# Patient Record
Sex: Male | Born: 1935 | Race: White | Hispanic: No
Health system: Southern US, Community
[De-identification: ages and names within clinical notes are randomized; demographics above are authoritative.]

---

## 2013-03-16 ENCOUNTER — Other Ambulatory Visit: Payer: Self-pay

## 2013-03-16 ENCOUNTER — Inpatient Hospital Stay (HOSPITAL_COMMUNITY)
Admission: EM | Admit: 2013-03-16 | Discharge: 2013-04-04 | DRG: 871 | Disposition: E | Payer: Medicare Other | Attending: Pulmonary Disease | Admitting: Pulmonary Disease

## 2013-03-16 ENCOUNTER — Emergency Department (HOSPITAL_COMMUNITY): Payer: Medicare Other

## 2013-03-16 DIAGNOSIS — Z881 Allergy status to other antibiotic agents status: Secondary | ICD-10-CM

## 2013-03-16 DIAGNOSIS — K449 Diaphragmatic hernia without obstruction or gangrene: Secondary | ICD-10-CM | POA: Diagnosis present

## 2013-03-16 DIAGNOSIS — Z66 Do not resuscitate: Secondary | ICD-10-CM | POA: Diagnosis not present

## 2013-03-16 DIAGNOSIS — E872 Acidosis, unspecified: Secondary | ICD-10-CM | POA: Diagnosis present

## 2013-03-16 DIAGNOSIS — G9349 Other encephalopathy: Secondary | ICD-10-CM | POA: Diagnosis present

## 2013-03-16 DIAGNOSIS — K562 Volvulus: Secondary | ICD-10-CM | POA: Diagnosis present

## 2013-03-16 DIAGNOSIS — E2749 Other adrenocortical insufficiency: Secondary | ICD-10-CM | POA: Diagnosis present

## 2013-03-16 DIAGNOSIS — J690 Pneumonitis due to inhalation of food and vomit: Secondary | ICD-10-CM

## 2013-03-16 DIAGNOSIS — A419 Sepsis, unspecified organism: Principal | ICD-10-CM | POA: Diagnosis present

## 2013-03-16 DIAGNOSIS — J4489 Other specified chronic obstructive pulmonary disease: Secondary | ICD-10-CM | POA: Diagnosis present

## 2013-03-16 DIAGNOSIS — J449 Chronic obstructive pulmonary disease, unspecified: Secondary | ICD-10-CM | POA: Diagnosis present

## 2013-03-16 DIAGNOSIS — I498 Other specified cardiac arrhythmias: Secondary | ICD-10-CM | POA: Diagnosis present

## 2013-03-16 DIAGNOSIS — N179 Acute kidney failure, unspecified: Secondary | ICD-10-CM | POA: Diagnosis present

## 2013-03-16 DIAGNOSIS — D696 Thrombocytopenia, unspecified: Secondary | ICD-10-CM | POA: Diagnosis not present

## 2013-03-16 DIAGNOSIS — I469 Cardiac arrest, cause unspecified: Secondary | ICD-10-CM | POA: Diagnosis present

## 2013-03-16 DIAGNOSIS — Z515 Encounter for palliative care: Secondary | ICD-10-CM

## 2013-03-16 DIAGNOSIS — I10 Essential (primary) hypertension: Secondary | ICD-10-CM | POA: Diagnosis present

## 2013-03-16 DIAGNOSIS — K319 Disease of stomach and duodenum, unspecified: Secondary | ICD-10-CM | POA: Diagnosis present

## 2013-03-16 DIAGNOSIS — J9600 Acute respiratory failure, unspecified whether with hypoxia or hypercapnia: Secondary | ICD-10-CM | POA: Diagnosis present

## 2013-03-16 LAB — POCT I-STAT TROPONIN I: Troponin i, poc: 0 ng/mL (ref 0.00–0.08)

## 2013-03-16 LAB — COMPREHENSIVE METABOLIC PANEL
ALT: 18 U/L (ref 0–53)
AST: 25 U/L (ref 0–37)
Alkaline Phosphatase: 84 U/L (ref 39–117)
CO2: 19 mEq/L (ref 19–32)
Chloride: 102 mEq/L (ref 96–112)
GFR calc Af Amer: 55 mL/min — ABNORMAL LOW (ref >90)
GFR calc non Af Amer: 47 mL/min — ABNORMAL LOW (ref >90)
Potassium: 3.3 mEq/L — ABNORMAL LOW (ref 3.5–5.1)
Sodium: 139 mEq/L (ref 135–145)
Total Bilirubin: 0.4 mg/dL (ref 0.3–1.2)

## 2013-03-16 LAB — LACTIC ACID, PLASMA: Lactic Acid, Venous: 6.9 mmol/L — ABNORMAL HIGH (ref 0.5–2.2)

## 2013-03-16 LAB — POCT I-STAT 3, ART BLOOD GAS (G3+)
Bicarbonate: 22.8 mEq/L (ref 20.0–24.0)
O2 Saturation: 85 %
Patient temperature: 98.6
pCO2 arterial: 71.8 mmHg (ref 35.0–45.0)
pH, Arterial: 7.11 — CL (ref 7.350–7.450)
pO2, Arterial: 69 mmHg — ABNORMAL LOW (ref 80.0–100.0)

## 2013-03-16 LAB — CBC WITH DIFFERENTIAL/PLATELET
Basophils Absolute: 0 10*3/uL (ref 0.0–0.1)
HCT: 43 % (ref 39.0–52.0)
Lymphocytes Relative: 12 % (ref 12–46)
MCHC: 33.5 g/dL (ref 30.0–36.0)
Neutro Abs: 13.9 10*3/uL — ABNORMAL HIGH (ref 1.7–7.7)
Neutrophils Relative %: 83 % — ABNORMAL HIGH (ref 43–77)
Platelets: 123 10*3/uL — ABNORMAL LOW (ref 150–400)
RDW: 13.6 % (ref 11.5–15.5)
WBC: 16.8 10*3/uL — ABNORMAL HIGH (ref 4.0–10.5)

## 2013-03-16 LAB — APTT: aPTT: 27 seconds (ref 24–37)

## 2013-03-16 LAB — AMYLASE: Amylase: 66 U/L (ref 0–105)

## 2013-03-16 LAB — PROTIME-INR: Prothrombin Time: 14.8 seconds (ref 11.6–15.2)

## 2013-03-16 MED ORDER — PANTOPRAZOLE SODIUM 40 MG IV SOLR
40.0000 mg | Freq: Every day | INTRAVENOUS | Status: DC
  Administered 2013-03-17: 40 mg via INTRAVENOUS
  Filled 2013-03-16 (×2): qty 40

## 2013-03-16 MED ORDER — INSULIN ASPART 100 UNIT/ML ~~LOC~~ SOLN
2.0000 [IU] | SUBCUTANEOUS | Status: DC
  Administered 2013-03-17: 4 [IU] via SUBCUTANEOUS
  Administered 2013-03-17: 2 [IU] via SUBCUTANEOUS
  Administered 2013-03-17: 4 [IU] via SUBCUTANEOUS
  Administered 2013-03-17 – 2013-03-18 (×3): 6 [IU] via SUBCUTANEOUS

## 2013-03-16 MED ORDER — IPRATROPIUM BROMIDE 0.02 % IN SOLN
0.5000 mg | RESPIRATORY_TRACT | Status: DC
  Administered 2013-03-17 – 2013-03-18 (×8): 0.5 mg via RESPIRATORY_TRACT
  Filled 2013-03-16 (×8): qty 2.5

## 2013-03-16 MED ORDER — SODIUM CHLORIDE 0.9 % IV SOLN: 250.0000 mL | INTRAVENOUS | Status: DC | PRN

## 2013-03-16 MED ORDER — HEPARIN SODIUM (PORCINE) 5000 UNIT/ML IJ SOLN
5000.0000 [IU] | Freq: Three times a day (TID) | INTRAMUSCULAR | Status: DC
  Administered 2013-03-17 – 2013-03-18 (×4): 5000 [IU] via SUBCUTANEOUS
  Filled 2013-03-16 (×7): qty 1

## 2013-03-16 MED ORDER — POTASSIUM CHLORIDE 10 MEQ/50ML IV SOLN
10.0000 meq | INTRAVENOUS | Status: AC
  Administered 2013-03-17 (×4): 10 meq via INTRAVENOUS
  Filled 2013-03-16 (×4): qty 50

## 2013-03-16 MED ORDER — ROCURONIUM BROMIDE 50 MG/5ML IV SOLN
100.0000 mg | Freq: Once | INTRAVENOUS | Status: AC
  Administered 2013-03-16: 100 mg via INTRAVENOUS

## 2013-03-16 MED ORDER — ALBUTEROL SULFATE (5 MG/ML) 0.5% IN NEBU
2.5000 mg | INHALATION_SOLUTION | RESPIRATORY_TRACT | Status: DC
  Administered 2013-03-17 – 2013-03-18 (×8): 2.5 mg via RESPIRATORY_TRACT
  Filled 2013-03-16 (×8): qty 0.5

## 2013-03-16 MED ORDER — ASPIRIN 81 MG PO CHEW: 324.0000 mg | CHEWABLE_TABLET | ORAL | Status: AC

## 2013-03-16 MED ORDER — ASPIRIN 300 MG RE SUPP
300.0000 mg | RECTAL | Status: AC
  Administered 2013-03-16: 300 mg via RECTAL
  Filled 2013-03-16: qty 1

## 2013-03-16 MED ORDER — ETOMIDATE 2 MG/ML IV SOLN
30.0000 mg | Freq: Once | INTRAVENOUS | Status: AC
  Administered 2013-03-16: 30 mg via INTRAVENOUS

## 2013-03-16 NOTE — ED Provider Notes (Signed)
I was present during the intubation and directly supervised the resident perform the intubation.   Gilda Crease, MD 03/23/2013 [phone]

## 2013-03-16 NOTE — ED Provider Notes (Addendum)
CSN: [id number]     Arrival date & time 03/27/2013  2249 History   First MD Initiated Contact with Patient 03/05/2013 2302     Chief Complaint  Patient presents with  . Cardiac Arrest   (Consider location/radiation/quality/duration/timing/severity/associated sxs/prior Treatment) HPI Comments: And brought to the by ambulance after apparent cardiac arrest. Patient had been experiencing chest pain at home and 911 was called. Upon arrival to the ER, patient was awake, alert and oriented continuing to complain of chest pain. Patient was being transported when he suddenly went unconscious and vomiting. EMS reports that he became briefly pulseless. They initiated CPR, administered epinephrine and atropine for one dose each. They estimate after approximately 2 minutes of CPR, patient regained pulses. He has, however, still unresponsive. They were unable to intubate, did place the [facility] airway. Arrival, patient is being bagged via [facility] airway. Level V Caveat due to patient condition.   No past medical history on file. No past surgical history on file. No family history on file. History  Substance Use Topics  . Smoking status: Not on file  . Smokeless tobacco: Not on file  . Alcohol Use: Not on file    Review of Systems  Unable to perform ROS: Acuity of condition    Allergies  Review of patient's allergies indicates not on file.  Home Medications   Current Outpatient Rx  Name  Route  Sig  Dispense  Refill  . EPINEPHRINE IJ   Injection   Inject 1 mg as directed once.          BP 110/70  Pulse 121  Resp 16  SpO2 98% Physical Exam  Constitutional: He appears well-developed and well-nourished.  HENT:  Head: Normocephalic and atraumatic.  Eyes: Pupils are equal, round, and reactive to light.  Neck: Neck supple.  Cardiovascular: Normal rate, regular rhythm and normal heart sounds.   Pulmonary/Chest:  Antibiotic [facility] airway, coarse breath sounds bilaterally  Abdominal: He exhibits  distension.  Musculoskeletal: He exhibits no edema.  Neurological:  Unresponsive  Skin: Skin is warm and dry.    ED Course  Procedures (including critical care time) Labs Review Labs Reviewed  CG4 I-STAT (LACTIC ACID) - Abnormal; Notable for the following:    Lactic Acid, Venous 6.96 (*)    All other components within normal limits  CULTURE, BLOOD (ROUTINE X 2)  CULTURE, BLOOD (ROUTINE X 2)  URINE CULTURE  CULTURE, RESPIRATORY (NON-EXPECTORATED)  CBC WITH DIFFERENTIAL  COMPREHENSIVE METABOLIC PANEL  PRO B NATRIURETIC PEPTIDE  TROPONIN I  PROTIME-INR  MAGNESIUM  PHOSPHORUS  AMYLASE  LIPASE, BLOOD  TROPONIN I  TROPONIN I  TROPONIN I  LACTIC ACID, PLASMA  CORTISOL  APTT  BLOOD GAS, ARTERIAL  POCT I-STAT TROPONIN I  TYPE AND SCREEN   Imaging Review No results found.  EKG Interpretation   None       MDM  Diagnosis: 1. Respiratory arrest, 2. Possible Cardiac contrast  Patient brought to the ER after her brief period of CPR during transport. He was initially called the EMS as a chest pain. Upon their arrival he was awake and alert complaining of continued chest pain. EKG at arrival does not show obvious ischemia or infarct.  Brought to the ER he was reevaluated. She had vomited during transport and there is concern for aspiration based on this. He patient was unresponsive at arrival, but did start to exhibit purposeful movement. Attempt was made to remove the [facility] airway, the patient was not protecting his airway and  not taking enough, therefore it was decided to admit the patient. Patient was intubated without difficulty.  Critical care was contacted. Patient will be admitted to critical care. Additionally, cardiology was consult and they will follow along.  CRITICAL CARE Performed by: Gilda Crease   Total critical care time:  Critical care time was exclusive of separately billable procedures and treating other patients.  Critical care was  necessary to treat or prevent imminent or life-threatening deterioration.  Critical care was time spent personally by me on the following activities: development of treatment plan with patient and/or surrogate as well as nursing, discussions with consultants, evaluation of patient's response to treatment, examination of patient, obtaining history from patient or surrogate, ordering and performing treatments and interventions, ordering and review of laboratory studies, ordering and review of radiographic studies, pulse oximetry and re-evaluation of patient's condition.   Gilda Crease, MD 03/15/2013 2325  Gilda Crease, MD 03/05/2013 [phone]

## 2013-03-16 NOTE — Progress Notes (Signed)
ANTIBIOTIC CONSULT NOTE - INITIAL  Pharmacy Consult for Vancomycin and Zosyn  Indication: rule out pneumonia  Allergies no known allergies  Patient Measurements: Height: 5\' 9"  (175.3 cm) Weight: 204 lb 9.4 oz (92.8 kg) IBW/kg (Calculated) : 70.7  Vital Signs: Temp: 94.6 F (34.8 C) (12/14 0032) Temp src: Core (Comment) (12/14 0032) BP: 118/97 mmHg (12/14 0032) Pulse Rate: 115 (12/14 0032) Intake/Output from previous day:   Intake/Output from this shift:    Labs:  Recent Labs  03/17/2013 2244  WBC 16.8*  HGB 14.4  PLT 123*  CREATININE 1.38*   Estimated Creatinine Clearance: 49.6 ml/min (by C-G formula based on Cr of 1.38). No results found for this basename: VANCOTROUGH, VANCOPEAK, VANCORANDOM, GENTTROUGH, GENTPEAK, GENTRANDOM, TOBRATROUGH, TOBRAPEAK, TOBRARND, AMIKACINPEAK, AMIKACINTROU, AMIKACIN,  in the last 72 hours   Microbiology: No results found for this or any previous visit (from the past 720 hour(s)).  Medical History: No past medical history on file.  Medications:  Maxzide  Flomax  Prilosec  Arthrotec  Klonopin  Assessment: 79 yo male with VRDF, possible PNA, for empiric antibiotics  Goal of Therapy:  Vancomycin trough level 15-20 mcg/ml  Plan:  Vancomycin 1500 mg IV now, then 1 g IV q24h Zosyn 3.375 g IV q8h   Rayhaan Huster, Gary Fleet 03/17/2013,12:33 AM

## 2013-03-16 NOTE — ED Provider Notes (Signed)
HPI Review of Systems Physical Exam INTUBATION Date/Time: 2013/03/28 11:19 PM Performed by: Gilda Crease. Authorized by: Arloa Koh Consent: The procedure was performed in an emergent situation. Indications: respiratory failure and airway protection Patient status: paralyzed (RSI) Sedatives: etomidate Paralytic: rocuronium Laryngoscope size: Mac 3 Tube size: 7.5 mm Tube type: cuffed Number of attempts: 1 Cricoid pressure: no Cords visualized: no Post-procedure assessment: chest rise and ETCO2 monitor Breath sounds: equal Cuff inflated: yes ETT to lip: 23 cm Tube secured with: ETT holder Chest x-ray interpreted by me. Chest x-ray findings: endotracheal tube in appropriate position Patient tolerance: Patient tolerated the procedure well with no immediate complications. Comments: Copious vomit in airway prior to me intubating      Arloa Koh, MD 03/28/13 2322

## 2013-03-16 NOTE — ED Notes (Signed)
Pt attempting to talk to staff at this time.  Pt mumbling words, but not able to maintain communication

## 2013-03-16 NOTE — H&P (Addendum)
PULMONARY  / CRITICAL CARE MEDICINE  Name: Gerald Moses MRN: [id number] DOB: Aug 05, 1932    ADMISSION DATE:  03/11/2013  PRIMARY SERVICE: PCCM  CHIEF COMPLAINT:   Chest pain, cardiac arrest.  BRIEF PATIENT DESCRIPTION:  79 years old male with no records here at [facility] and unknown PMH. As per EMS and nurses in the ED 911 was called because of chest pain and vomiting for 2 days. The patient briefly coded in the ambulance and returned to spontaneous circulation after about two minutes of CPR.   SIGNIFICANT EVENTS / STUDIES:  - Chest X ray with no lung infiltrates. ETT in adequate position.   LINES / TUBES: - Peripheral IV's - Left IJ CVC - Foley catheter.   CULTURES: Will get blood, urine and tracheal aspirate cultures.  ANTIBIOTICS: - Zosyn - Vancomycin  HISTORY OF PRESENT ILLNESS:   79 years old male with no records here at [facility] and unknown PMH. As per his medication bottles he probably has HTN, COPD and prostate issues. He takes benzodiazepines and narcotics. As per EMS and nurses in the ED 911 was called because of chest pain and vomiting for 2 days. In the ambulance the patient vomited and briefly became pulseless. CPR was given for less than 2 minutes. Atropine and epinephrine x 1 were given with return to spontaneous circulation. They placed a [facility] airway. In the ED he briefly recovered consciousness and was disoriented. During intubation, copious amounts of vomit were noticed in the airway. At the time of my examination the patient is intubated, unresponsive, Saturating 100% on 100% FiO2. MAP's in the 70's.   PAST MEDICAL HISTORY :  No past medical history on file. No past surgical history on file. Prior to Admission medications   Medication Sig Start Date End Date Taking? Authorizing Provider  albuterol (PROVENTIL HFA;VENTOLIN HFA) 108 (90 BASE) MCG/ACT inhaler Inhale 1-2 puffs into the lungs every 6 (six) hours as needed for wheezing or shortness of breath.   Yes  Historical Provider, MD  cefdinir (OMNICEF) 300 MG capsule Take 300 mg by mouth 2 (two) times daily. Beginning 03/11/13 for 10 days   Yes Historical Provider, MD  clonazePAM (KLONOPIN) 1 MG tablet Take 1 mg by mouth 2 (two) times daily as needed for anxiety.   Yes Historical Provider, MD  diclofenac (VOLTAREN) 25 MG EC tablet Take 25 mg by mouth daily. Take with misoprostol   Yes Historical Provider, MD  finasteride (PROSCAR) 5 MG tablet Take 5 mg by mouth daily.   Yes Historical Provider, MD  fluticasone (FLONASE) 50 MCG/ACT nasal spray Place 1-2 sprays into both nostrils daily.   Yes Historical Provider, MD  HYDROcodone-acetaminophen (NORCO) 10-325 MG per tablet Take 0.5-1 tablets by mouth every 4 (four) hours as needed for moderate pain.   Yes Historical Provider, MD  misoprostol (CYTOTEC) 200 MCG tablet Take 200 mcg by mouth daily. Take with diclofenac   Yes Historical Provider, MD  Multiple Vitamin (MULTIVITAMIN WITH MINERALS) TABS tablet Take 1 tablet by mouth daily.   Yes Historical Provider, MD  omeprazole (PRILOSEC) 20 MG capsule Take 40 mg by mouth daily.   Yes Historical Provider, MD  oxymetazoline (AFRIN) 0.05 % nasal spray Place 1-2 sprays into both nostrils 2 (two) times daily as needed for congestion.   Yes Historical Provider, MD  tamsulosin (FLOMAX) 0.4 MG CAPS capsule Take 0.4 mg by mouth.   Yes Historical Provider, MD  triamterene-hydrochlorothiazide (MAXZIDE-25) 37.5-25 MG per tablet Take 0.5-1 tablets by mouth daily.  Yes Historical Provider, MD  atropine 1 MG/ML injection Inject 1 mg into the vein once.    Historical Provider, MD  EPINEPHRINE IJ Inject 1 mg as directed once.    Historical Provider, MD   Allergies no known allergies  FAMILY HISTORY:  No family history on file. SOCIAL HISTORY:  has no tobacco, alcohol, and drug history on file.  REVIEW OF SYSTEMS:  Unable to provide  SUBJECTIVE:   VITAL SIGNS: Temp:  [95.6 F (35.3 C)] 95.6 F (35.3 C) (12/13  2308) Pulse Rate:  [112-121] 119 (12/13 2315) Resp:  [16-22] 22 (12/13 2315) BP: (103-126)/(68-73) 126/73 mmHg (12/13 2315) SpO2:  [93 %-99 %] 93 % (12/13 2315) FiO2 (%):  [60 %-100 %] 100 % (12/13 2300) HEMODYNAMICS:   VENTILATOR SETTINGS: Vent Mode:  [-] PRVC FiO2 (%):  [60 %-100 %] 100 % Set Rate:  [22 bmp-28 bmp] 28 bmp Vt Set:  [440 mL] 440 mL PEEP:  [5 cmH20] 5 cmH20 Plateau Pressure:  [18 cmH20] 18 cmH20 INTAKE / OUTPUT: Intake/Output   None     PHYSICAL EXAMINATION: General: Sedated, intubated, no acute distress. Eyes: Anicteric sclerae. Pupils are ENT: ETT in place. Trachea at midline.  Lymph: No cervical, supraclavicular, or axillary lymphadenopathy. Heart: Normal S1, S2. No murmurs, rubs, or gallops appreciated. No bruits, equal pulses. Lungs: Normal excursion, no dullness to percussion. Good air movement bilaterally, without wheezes or crackles.  Abdomen: Abdomen soft, non-tender and not distended, normoactive bowel sounds. No hepatosplenomegaly or masses. Musculoskeletal: No clubbing or synovitis. No LE edema Skin: No rashes or lesions Neuro: Patient is unresponsive.   LABS:  CBC  Recent Labs Lab 03/17/2013 2244  WBC 16.8*  HGB 14.4  HCT 43.0  PLT 123*   Coag's  Recent Labs Lab 03/30/2013 2244 03/17/2013 2300  APTT  --  27  INR 1.19  --    BMET  Recent Labs Lab 03/27/2013 2244  NA 139  K 3.3*  CL 102  CO2 19  BUN 19  CREATININE 1.38*  GLUCOSE 269*   Electrolytes  Recent Labs Lab 03/17/2013 2244  CALCIUM 8.7   Sepsis Markers  Recent Labs Lab 03/13/2013 2252  LATICACIDVEN 6.96*   ABG  Recent Labs Lab 04/01/2013 2332  PHART 7.110*  PCO2ART 71.8*  PO2ART 69.0*   Liver Enzymes  Recent Labs Lab 03/09/2013 2244  AST 25  ALT 18  ALKPHOS 84  BILITOT 0.4  ALBUMIN 3.6   Cardiac Enzymes  Recent Labs Lab 03/13/2013 2244  TROPONINI <0.30  PROBNP 137.2   Glucose No results found for this basename: GLUCAP,  in the last 168  hours  Imaging Dg Chest Port 1 View  04/03/2013   CLINICAL DATA:  Endotracheal tube placement.  EXAM: PORTABLE CHEST - 1 VIEW  COMPARISON:  None.  FINDINGS: The patient's endotracheal tube is seen ending 3-4 cm above the carina.  The lungs are well-aerated. Vascular congestion is noted, without definite pulmonary edema. No pleural effusion or pneumothorax is seen.  The cardiomediastinal silhouette remains normal in size. There is suggestion of an underlying moderate hiatal hernia. No acute osseous abnormalities are seen. The patient's left shoulder arthroplasty is incompletely imaged but appears grossly unremarkable  IMPRESSION: 1. Endotracheal tube seen ending 3-4 cm above the carina. 2. Vascular congestion noted; lungs remain grossly clear. 3. Suggestion of underlying moderate hiatal hernia. If the patient does not have a known history of hiatal hernia, a lateral view of the chest could be considered for further evaluation,  though the appearance is relatively typical.   Electronically Signed   By: Roanna Raider M.D.   On: 03/06/2013 23:40     ASSESSMENT / PLAN:  PULMONARY A: 1) Acute hypoxemic respiratory failure secondary to cardiac arrest. 2) Aspiration pneumonitis / pneumonia P:   - Mechanical ventilation   - PRVC, Vt: 8cc/kg, PEEP: 5, RR: 22, FiO2: 100% and adjust to keep O2 sat > 94%   - VAP prevention order set   - Daily awakening and SBT   CARDIOVASCULAR A:  1) Witnessed cardiac arrest, down time less than two minutes. In the ED regained consciousness briefly. Not a candidate for hypothermia protocol.  2) EKG sinus rhythm. No STEMI 3) Chest pain 4) Lactic acidosis P:  - Aspirin - Cardiology consult - Serial troponins - Echocardiogram - Cardiology consult  RENAL A:   1) Acute renal failure P:   - IVF's - Follow BMP  GASTROINTESTINAL A:   1) Vomiting, unclear etiology P:   - Will get non contrast CT of the abdomen given elevated creatinine. - GI prophylaxis with  protonix  HEMATOLOGIC A:   1) No issues P:  - Will follow CBC  INFECTIOUS A:   1) Aspiration. Unclear if any other source of infection. Will cover empirically and follow cultures. P:   - Zosyn - Vancomycin - Follow cultures and adjust antibiotics accordingly  ENDOCRINE A:   1) Hyperglycemia P:   - ICU hyperglycemia protocol with SQ insulin sliding scale.   NEUROLOGIC A:   1) Intubated, sedated P:   - Propofol    I have personally obtained a history, examined the patient, evaluated laboratory and imaging results, formulated the assessment and plan and placed orders. CRITICAL CARE: Critical Care Time devoted to patient care services described in this note is 60 minutes.   Overton Mam, MD Pulmonary and Critical Care Medicine [facility] Pager: [phone]  03/10/2013, 11:46 PM

## 2013-03-16 NOTE — ED Notes (Signed)
Lactic Acid results reported to Dr.Pollina

## 2013-03-16 NOTE — ED Notes (Signed)
Pt arrives via EMS unconsious and being bagged through [facility].  Pt called 911 for complaint of chest pain.  During transport, pt began to vomit and went unconscious.  Pt vomited multiple more times in route, and soiled himself of urine.  Pt was given one cycle of CPR, 1 mg of EPI, and 1 MG of atropine.  EMS reports pt stated he had been vomiting off and on for two days with some abd discomfort.

## 2013-03-16 NOTE — Progress Notes (Signed)
Changes per MD 

## 2013-03-17 ENCOUNTER — Inpatient Hospital Stay (HOSPITAL_COMMUNITY): Payer: Medicare Other

## 2013-03-17 DIAGNOSIS — I469 Cardiac arrest, cause unspecified: Secondary | ICD-10-CM

## 2013-03-17 DIAGNOSIS — R579 Shock, unspecified: Secondary | ICD-10-CM

## 2013-03-17 DIAGNOSIS — J690 Pneumonitis due to inhalation of food and vomit: Secondary | ICD-10-CM

## 2013-03-17 DIAGNOSIS — K562 Volvulus: Secondary | ICD-10-CM

## 2013-03-17 DIAGNOSIS — E872 Acidosis: Secondary | ICD-10-CM

## 2013-03-17 DIAGNOSIS — R079 Chest pain, unspecified: Secondary | ICD-10-CM

## 2013-03-17 DIAGNOSIS — J9600 Acute respiratory failure, unspecified whether with hypoxia or hypercapnia: Secondary | ICD-10-CM

## 2013-03-17 DIAGNOSIS — A419 Sepsis, unspecified organism: Principal | ICD-10-CM

## 2013-03-17 LAB — POCT I-STAT 3, ART BLOOD GAS (G3+)
Acid-base deficit: 4 mmol/L — ABNORMAL HIGH (ref 0.0–2.0)
Acid-base deficit: 7 mmol/L — ABNORMAL HIGH (ref 0.0–2.0)
Acid-base deficit: 7 mmol/L — ABNORMAL HIGH (ref 0.0–2.0)
Acid-base deficit: 12 mmol/L — ABNORMAL HIGH (ref 0.0–2.0)
Acid-base deficit: 12 mmol/L — ABNORMAL HIGH (ref 0.0–2.0)
Bicarbonate: 26.2 mEq/L — ABNORMAL HIGH (ref 20.0–24.0)
Bicarbonate: 22.2 mEq/L (ref 20.0–24.0)
Bicarbonate: 22 mEq/L (ref 20.0–24.0)
Bicarbonate: 15.1 mEq/L — ABNORMAL LOW (ref 20.0–24.0)
Bicarbonate: 19.8 mEq/L — ABNORMAL LOW (ref 20.0–24.0)
O2 Saturation: 93 %
O2 Saturation: 99 %
O2 Saturation: 99 %
Patient temperature: 34.7
Patient temperature: 37.1
Patient temperature: 98.6
TCO2: 28 mmol/L (ref 0–100)
TCO2: 24 mmol/L (ref 0–100)
TCO2: 24 mmol/L (ref 0–100)
TCO2: 16 mmol/L (ref 0–100)
TCO2: 22 mmol/L (ref 0–100)
TCO2: 21 mmol/L (ref 0–100)
pCO2 arterial: 59.2 mmHg (ref 35.0–45.0)
pCO2 arterial: 41.3 mmHg (ref 35.0–45.0)
pH, Arterial: 7.219 — ABNORMAL LOW (ref 7.350–7.450)
pH, Arterial: 7.182 — CL (ref 7.350–7.450)
pH, Arterial: 7.215 — ABNORMAL LOW (ref 7.350–7.450)
pH, Arterial: 7.172 — CL (ref 7.350–7.450)
pO2, Arterial: 133 mmHg — ABNORMAL HIGH (ref 80.0–100.0)
pO2, Arterial: 205 mmHg — ABNORMAL HIGH (ref 80.0–100.0)

## 2013-03-17 LAB — CBC
Hemoglobin: 15.2 g/dL (ref 13.0–17.0)
MCH: 32.8 pg (ref 26.0–34.0)
MCHC: 33.4 g/dL (ref 30.0–36.0)
RDW: 14 % (ref 11.5–15.5)

## 2013-03-17 LAB — GLUCOSE, CAPILLARY
Glucose-Capillary: 171 mg/dL — ABNORMAL HIGH (ref 70–99)
Glucose-Capillary: 115 mg/dL — ABNORMAL HIGH (ref 70–99)
Glucose-Capillary: 120 mg/dL — ABNORMAL HIGH (ref 70–99)
Glucose-Capillary: 156 mg/dL — ABNORMAL HIGH (ref 70–99)
Glucose-Capillary: 147 mg/dL — ABNORMAL HIGH (ref 70–99)

## 2013-03-17 LAB — BASIC METABOLIC PANEL
CO2: 22 mEq/L (ref 19–32)
CO2: 20 mEq/L (ref 19–32)
Calcium: 7.8 mg/dL — ABNORMAL LOW (ref 8.4–10.5)
Chloride: 105 mEq/L (ref 96–112)
Creatinine, Ser: 1.38 mg/dL — ABNORMAL HIGH (ref 0.50–1.35)
Creatinine, Ser: 1.36 mg/dL — ABNORMAL HIGH (ref 0.50–1.35)
GFR calc Af Amer: 55 mL/min — ABNORMAL LOW (ref >90)
GFR calc non Af Amer: 47 mL/min — ABNORMAL LOW (ref >90)
Glucose, Bld: 117 mg/dL — ABNORMAL HIGH (ref 70–99)
Sodium: 136 mEq/L (ref 135–145)

## 2013-03-17 LAB — DIFFERENTIAL
Basophils Relative: 0 % (ref 0–1)
Lymphs Abs: 0.2 10*3/uL — ABNORMAL LOW (ref 0.7–4.0)
Monocytes Relative: 4 % (ref 3–12)
Neutro Abs: 1.6 10*3/uL — ABNORMAL LOW (ref 1.7–7.7)
Neutrophils Relative %: 83 % — ABNORMAL HIGH (ref 43–77)

## 2013-03-17 LAB — LACTIC ACID, PLASMA: Lactic Acid, Venous: 3.6 mmol/L — ABNORMAL HIGH (ref 0.5–2.2)

## 2013-03-17 LAB — BLOOD GAS, ARTERIAL
Acid-base deficit: 9.9 mmol/L — ABNORMAL HIGH (ref 0.0–2.0)
Drawn by: 23604
O2 Saturation: 99.4 %
PEEP: 5 cmH2O
Patient temperature: 100.2
RATE: 35 resp/min
TCO2: 19.7 mmol/L (ref 0–100)
pO2, Arterial: 225 mmHg — ABNORMAL HIGH (ref 80.0–100.0)

## 2013-03-17 LAB — PHOSPHORUS: Phosphorus: 3.6 mg/dL (ref 2.3–4.6)

## 2013-03-17 LAB — INFLUENZA PANEL BY PCR (TYPE A & B): H1N1 flu by pcr: NOT DETECTED

## 2013-03-17 LAB — TROPONIN I
Troponin I: <0.3 ng/mL (ref <0.30)
Troponin I: <0.3 ng/mL (ref <0.30)

## 2013-03-17 LAB — PROCALCITONIN: Procalcitonin: 13.13 ng/mL

## 2013-03-17 LAB — TYPE AND SCREEN

## 2013-03-17 LAB — ABO/RH: ABO/RH(D): O POS

## 2013-03-17 LAB — CORTISOL: Cortisol, Plasma: 23.4 ug/dL

## 2013-03-17 LAB — MAGNESIUM: Magnesium: 1.8 mg/dL (ref 1.5–2.5)

## 2013-03-17 LAB — MRSA PCR SCREENING: MRSA by PCR: NEGATIVE

## 2013-03-17 MED ORDER — FENTANYL CITRATE 0.05 MG/ML IJ SOLN
50.0000 ug | Freq: Once | INTRAMUSCULAR | Status: AC
  Administered 2013-03-17: 50 ug via INTRAVENOUS

## 2013-03-17 MED ORDER — CISATRACURIUM BOLUS VIA INFUSION
10.0000 mg | Freq: Once | INTRAVENOUS | Status: AC
  Administered 2013-03-17: 10 mg via INTRAVENOUS
  Filled 2013-03-17: qty 10

## 2013-03-17 MED ORDER — SODIUM CHLORIDE 0.9 % IV SOLN
500.0000 mL | Freq: Once | INTRAVENOUS | Status: AC
  Administered 2013-03-17: 500 mL via INTRAVENOUS

## 2013-03-17 MED ORDER — VASOPRESSIN 20 UNIT/ML IJ SOLN
0.0300 [IU]/min | INTRAVENOUS | Status: DC
  Administered 2013-03-17: 0.03 [IU]/min via INTRAVENOUS
  Filled 2013-03-17 (×2): qty 2.5

## 2013-03-17 MED ORDER — SODIUM BICARBONATE 8.4 % IV SOLN
INTRAVENOUS | Status: AC
  Filled 2013-03-17: qty 100

## 2013-03-17 MED ORDER — ALBUMIN HUMAN 5 % IV SOLN
25.0000 g | Freq: Once | INTRAVENOUS | Status: AC
  Administered 2013-03-17: 25 g via INTRAVENOUS
  Filled 2013-03-17: qty 500

## 2013-03-17 MED ORDER — VANCOMYCIN HCL IN DEXTROSE 1-5 GM/200ML-% IV SOLN: 1000.0000 mg | INTRAVENOUS | Status: DC

## 2013-03-17 MED ORDER — SODIUM BICARBONATE 8.4 % IV SOLN
100.0000 meq | Freq: Once | INTRAVENOUS | Status: AC
  Administered 2013-03-17: 100 meq via INTRAVENOUS

## 2013-03-17 MED ORDER — NOREPINEPHRINE BITARTRATE 1 MG/ML IJ SOLN
2.0000 ug/min | INTRAMUSCULAR | Status: DC
  Administered 2013-03-17: 2 ug/min via INTRAVENOUS
  Administered 2013-03-17: 50 ug/min via INTRAVENOUS
  Filled 2013-03-17 (×3): qty 4

## 2013-03-17 MED ORDER — DEXTROSE 5 % IV SOLN
30.0000 ug/min | INTRAVENOUS | Status: DC
  Administered 2013-03-17: 50 ug/min via INTRAVENOUS
  Administered 2013-03-17: 30 ug/min via INTRAVENOUS
  Administered 2013-03-17: 50 ug/min via INTRAVENOUS
  Filled 2013-03-17 (×5): qty 1

## 2013-03-17 MED ORDER — PROPOFOL 10 MG/ML IV EMUL
0.0000 ug/kg/min | INTRAVENOUS | Status: DC
  Administered 2013-03-17: 45 ug/kg/min via INTRAVENOUS
  Administered 2013-03-17: 5 ug/kg/min via INTRAVENOUS
  Administered 2013-03-17: 45 ug/kg/min via INTRAVENOUS
  Administered 2013-03-17: 15 ug/kg/min via INTRAVENOUS
  Filled 2013-03-17 (×6): qty 100

## 2013-03-17 MED ORDER — FENTANYL BOLUS VIA INFUSION
25.0000 ug | INTRAVENOUS | Status: DC | PRN
  Filled 2013-03-17: qty 50

## 2013-03-17 MED ORDER — SODIUM CHLORIDE 0.9 % IV BOLUS (SEPSIS)
1000.0000 mL | Freq: Once | INTRAVENOUS | Status: AC
  Administered 2013-03-17: 1000 mL via INTRAVENOUS

## 2013-03-17 MED ORDER — VANCOMYCIN HCL 10 G IV SOLR
1500.0000 mg | INTRAVENOUS | Status: DC
  Administered 2013-03-18: 1500 mg via INTRAVENOUS
  Filled 2013-03-17: qty 1500

## 2013-03-17 MED ORDER — SODIUM CHLORIDE 0.9 % IV SOLN: INTRAVENOUS | Status: DC

## 2013-03-17 MED ORDER — ALBUMIN HUMAN 5 % IV SOLN
INTRAVENOUS | Status: AC
  Filled 2013-03-17: qty 250

## 2013-03-17 MED ORDER — SODIUM CHLORIDE 0.9 % IV BOLUS (SEPSIS)
1000.0000 mL | Freq: Once | INTRAVENOUS | Status: AC
Start: 2013-03-17 — End: 2013-03-17
  Administered 2013-03-17: 1000 mL via INTRAVENOUS

## 2013-03-17 MED ORDER — NOREPINEPHRINE BITARTRATE 1 MG/ML IJ SOLN
2.0000 ug/min | INTRAVENOUS | Status: DC
  Administered 2013-03-17 – 2013-03-18 (×2): 50 ug/min via INTRAVENOUS
  Filled 2013-03-17 (×2): qty 16

## 2013-03-17 MED ORDER — NOREPINEPHRINE BITARTRATE 1 MG/ML IJ SOLN
2.0000 ug/min | INTRAVENOUS | Status: DC
  Administered 2013-03-17 (×2): 50 ug/min via INTRAVENOUS
  Filled 2013-03-17 (×5): qty 8

## 2013-03-17 MED ORDER — FENTANYL CITRATE 0.05 MG/ML IJ SOLN
50.0000 ug | INTRAMUSCULAR | Status: DC | PRN
  Filled 2013-03-17: qty 2

## 2013-03-17 MED ORDER — SODIUM CHLORIDE 0.9 % IV BOLUS (SEPSIS): 1000.0000 mL | INTRAVENOUS | Status: DC | PRN

## 2013-03-17 MED ORDER — SODIUM CHLORIDE 0.9 % IV SOLN
3.0000 ug/kg/min | INTRAVENOUS | Status: DC
  Administered 2013-03-17: 1 ug/kg/min via INTRAVENOUS
  Filled 2013-03-17 (×2): qty 20

## 2013-03-17 MED ORDER — ALBUMIN HUMAN 5 % IV SOLN
INTRAVENOUS | Status: AC
  Administered 2013-03-17: 25 g via INTRAVENOUS
  Filled 2013-03-17: qty 250

## 2013-03-17 MED ORDER — PIPERACILLIN-TAZOBACTAM 3.375 G IVPB
3.3750 g | Freq: Three times a day (TID) | INTRAVENOUS | Status: DC
  Administered 2013-03-17 – 2013-03-18 (×5): 3.375 g via INTRAVENOUS
  Filled 2013-03-17 (×6): qty 50

## 2013-03-17 MED ORDER — DEXTROSE 5 % IV SOLN
INTRAVENOUS | Status: DC
  Administered 2013-03-17: 19:00:00 via INTRAVENOUS
  Filled 2013-03-17 (×6): qty 150

## 2013-03-17 MED ORDER — SODIUM CHLORIDE 0.9 % IV SOLN
INTRAVENOUS | Status: DC
  Administered 2013-03-17: 100 mL/h via INTRAVENOUS

## 2013-03-17 MED ORDER — SODIUM CHLORIDE 0.9 % IV SOLN: 250.0000 mL | INTRAVENOUS | Status: DC | PRN

## 2013-03-17 MED ORDER — VANCOMYCIN HCL 10 G IV SOLR
1500.0000 mg | Freq: Once | INTRAVENOUS | Status: AC
  Administered 2013-03-17: 1500 mg via INTRAVENOUS
  Filled 2013-03-17: qty 1500

## 2013-03-17 MED ORDER — SODIUM CHLORIDE 0.9 % IV SOLN
0.0000 ug/h | INTRAVENOUS | Status: DC
  Administered 2013-03-17: 50 ug/h via INTRAVENOUS
  Filled 2013-03-17: qty 50

## 2013-03-17 MED ORDER — HYDROCORTISONE SOD SUCCINATE 100 MG IJ SOLR
100.0000 mg | Freq: Three times a day (TID) | INTRAMUSCULAR | Status: DC
  Administered 2013-03-17: 100 mg via INTRAVENOUS
  Administered 2013-03-17: 19:00:00 via INTRAVENOUS
  Administered 2013-03-18 (×2): 100 mg via INTRAVENOUS
  Filled 2013-03-17 (×7): qty 2

## 2013-03-17 MED ORDER — ARTIFICIAL TEARS OP OINT
1.0000 "application " | TOPICAL_OINTMENT | Freq: Three times a day (TID) | OPHTHALMIC | Status: DC
  Administered 2013-03-17 – 2013-03-18 (×3): 1 via OPHTHALMIC
  Filled 2013-03-17 (×2): qty 3.5

## 2013-03-17 NOTE — Progress Notes (Signed)
Patient ID: Gerald Moses, male   DOB: 04-Dec-1932, 79 y.o.   MRN: [id number]  Examined a couple of hours after Dr. Dixon Boos initial consult.  Critical care medicine continuing to work on the patient.  He is still requiring significant cardiopulmonary support with Levophed and Vasopressin.  Tachycardic.  His abdomen remains soft.  I discussed the situation with his family that was present.  At some point, he would benefit from a laparoscopic reduction of his hiatal hernia with repair of the hiatus, possible gastropexy.  At this time, there is no sign of ischemia or perforation.  If his overall cardiac/pulmonary status improves, then would recommend surgery.  The family would like to consider transfer to [facility] when he is more stable, since they live closer to that hospital.    Gerald Arms. Corliss Skains, MD, [facility]  General/ Trauma Surgery  03/17/2013 9:10 AM

## 2013-03-17 NOTE — Progress Notes (Signed)
ANTIBIOTIC CONSULT NOTE - FOLLOW UP  Pharmacy Consult:  Vancomycin Indication:  PNA  Allergies  Allergen Reactions  . Augmentin [Amoxicillin-Pot Clavulanate]     unknown  . Erythromycin     Unknown reaction.    Patient Measurements: Height: 5\' 9"  (175.3 cm) Weight: 210 lb 5.1 oz (95.4 kg) IBW/kg (Calculated) : 70.7  Vital Signs: Temp: 98.6 F (37 C) (12/14 0800) Temp src: Core (Comment) (12/14 0800) BP: 104/78 mmHg (12/14 0800) Pulse Rate: 130 (12/14 0800) Intake/Output from previous day: 12/13 0701 - 12/14 0700 In: 2325.7 [I.V.:1075.7; IV Piggyback:1250] Out: 467 [Urine:267; Emesis/NG output:200] Intake/Output from this shift: Total I/O In: 322.9 [I.V.:322.9] Out: 30 [Urine:30]  Labs:  Recent Labs  03/10/2013 2244 03/17/13 0650  WBC 16.8* 1.8*  HGB 14.4 15.2  PLT 123* 148*  CREATININE 1.38* 1.38*   Estimated Creatinine Clearance: 50.3 ml/min (by C-G formula based on Cr of 1.38). No results found for this basename: VANCOTROUGH, Leodis Binet, VANCORANDOM, GENTTROUGH, GENTPEAK, GENTRANDOM, TOBRATROUGH, TOBRAPEAK, TOBRARND, AMIKACINPEAK, AMIKACINTROU, AMIKACIN,  in the last 72 hours   Microbiology: Recent Results (from the past 720 hour(s))  MRSA PCR SCREENING     Status: None   Collection Time    03/17/13  2:02 AM      Result Value Range Status   MRSA by PCR NEGATIVE  NEGATIVE Final   Comment:            The GeneXpert MRSA Assay (FDA     approved for NASAL specimens     only), is one component of a     comprehensive MRSA colonization     surveillance program. It is not     intended to diagnose MRSA     infection nor to guide or     monitor treatment for     MRSA infections.      Assessment: 65 YOM admitted s/p cardiac arrest/VDRF to continue on vancomycin and Zosyn for PNA.  Patient with some renal dysfunction but SCr appears to be stable.  Vanc 12/14 >> Zosyn 12/14 >>  12/14 blood cx x2 12/13 urine cx - collected   Goal of Therapy:  Vancomycin  trough level 15-20 mcg/ml   Plan:  - Change Vanc to 1500mg  IV Q24H - Zosyn 3.375gm IV Q8H, 4 hr infusion - Monitor renal fxn, clinical course, vanc trough at Css - F/U order for ASA and statin    Shafter Jupin D. Laney Potash, PharmD, BCPS Pager:  [phone] 03/17/2013, 9:10 AM

## 2013-03-17 NOTE — Procedures (Signed)
Central Venous Catheter Insertion Procedure Note Gerald Moses [id number] [birth year]  Procedure: Insertion of Central Venous Catheter Indications: Assessment of intravascular volume, Drug and/or fluid administration and Frequent blood sampling  Procedure Details Consent: Unable to obtain consent because of emergent medical necessity. Time Out: Verified patient identification, verified procedure, site/side was marked, verified correct patient position, special equipment/implants available, medications/allergies/relevent history reviewed, required imaging and test results available.  Performed  Maximum sterile technique was used including antiseptics, cap, gloves, gown, hand hygiene, mask and sheet. Skin prep: Chlorhexidine; local anesthetic administered A antimicrobial bonded/coated triple lumen catheter was placed in the left internal jugular vein using the Seldinger technique.  Evaluation Blood flow good Complications: No apparent complications Patient did tolerate procedure well. Chest X-ray ordered to verify placement.  CXR: pending.  Overton Mam, M.D. Pulmonary and Critical Care Medicine Call E-link with questions [phone] 03/17/2013, 1:06 AM

## 2013-03-17 NOTE — Progress Notes (Signed)
PULMONARY  / CRITICAL CARE MEDICINE  Name: Gerald Moses MRN: [id number] DOB: Sep 24, 1932    ADMISSION DATE:  March 30, 2013  REFERRING MD :  ER  CHIEF COMPLAINT:  Chest pain  BRIEF PATIENT DESCRIPTION:  79 yo c/o chest pain and vomiting for 2 days.  EMS called, and pt had brief cardiac arrest in transport to hospital.  Aspirated prior to intubation.  PCCM asked to admit to ICU.  SIGNIFICANT EVENTS: 12/13 Admit, VDRF, shock, CCS consulted, Cardiology consulted 12/14 Sepsis protocol, add nimbex, change to pressure control   STUDIES:  12/14 CT abd/pelvis  LINES / TUBES: ETT 12/13 >> Lt IJ CVL 12/14 >>  Rt radial aline 12/14 >>   CULTURES: Blood 12/14 >> Sputum 12/14 >> Urine 12/14  ANTIBIOTICS: Vancomycin 12/14 >> Zosyn 12/14 >>   SUBJECTIVE:  Remains on pressors.  VITAL SIGNS: Temp:  [94.3 F (34.6 C)-98.8 F (37.1 C)] 98.6 F (37 C) (12/14 0800) Pulse Rate:  [93-134] 130 (12/14 0800) Resp:  [16-30] 24 (12/14 0800) BP: (67-158)/(23-97) 104/78 mmHg (12/14 0800) SpO2:  [81 %-100 %] 97 % (12/14 0810) Arterial Line BP: (83-128)/(46-63) 98/56 mmHg (12/14 0800) FiO2 (%):  [60 %-100 %] 100 % (12/14 0810) Weight:  [204 lb 9.4 oz (92.8 kg)-210 lb 5.1 oz (95.4 kg)] 210 lb 5.1 oz (95.4 kg) (12/14 0500) HEMODYNAMICS: CVP:  [6 mmHg] 6 mmHg VENTILATOR SETTINGS: Vent Mode:  [-] PRVC FiO2 (%):  [60 %-100 %] 100 % Set Rate:  [22 bmp-35 bmp] 35 bmp Vt Set:  [440 mL-600 mL] 600 mL PEEP:  [5 cmH20] 5 cmH20 Plateau Pressure:  [18 cmH20-30 cmH20] 30 cmH20 INTAKE / OUTPUT: Intake/Output     12/13 0701 - 12/14 0700 12/14 0701 - 12/15 0700   I.V. (mL/kg) 1075.7 (11.3) 322.9 (3.4)   IV Piggyback 1250    Total Intake(mL/kg) 2325.7 (24.4) 322.9 (3.4)   Urine (mL/kg/hr) 267 30 (0.2)   Emesis/NG output 200    Total Output 467 30   Net +1858.7 +292.9          PHYSICAL EXAMINATION: General: Increased WOB Neuro:  Sedated HEENT:  ETT in place Cardiovascular:   tachycardic Lungs:  B/l rhonchi Abdomen:  Soft, decreased bowel sounds Musculoskeletal:  No edema Skin:  cool  LABS:  CBC  Recent Labs Lab 30-Mar-2013 2244 03/17/13 0650  WBC 16.8* 1.8*  HGB 14.4 15.2  HCT 43.0 45.5  PLT 123* 148*   Coag's  Recent Labs Lab March 30, 2013 2244 03-30-13 2300  APTT  --  27  INR 1.19  --    BMET  Recent Labs Lab 30-Mar-2013 2244 03/17/13 0650  NA 139 142  K 3.3* 4.1  CL 102 109  CO2 19 22  BUN 19 20  CREATININE 1.38* 1.38*  GLUCOSE 269* 117*   Electrolytes  Recent Labs Lab 2013-03-30 2244 03/30/2013 2300 03/17/13 0650  CALCIUM 8.7  --  7.8*  MG  --  2.3 1.8  PHOS  --  4.5 3.6   Sepsis Markers  Recent Labs Lab 03-30-2013 2252 2013-03-30 2300 03/17/13 0355  LATICACIDVEN 6.96* 6.9* 2.8*   ABG  Recent Labs Lab 03/17/13 0136 03/17/13 0614 03/17/13 0732  PHART 7.219* 7.182* 7.215*  PCO2ART 62.3* 59.2* 54.4*  PO2ART 75.0* 133.0* 60.0*   Liver Enzymes  Recent Labs Lab 30-Mar-2013 2244  AST 25  ALT 18  ALKPHOS 84  BILITOT 0.4  ALBUMIN 3.6   Cardiac Enzymes  Recent Labs Lab 30-Mar-2013 2244 03/30/2013 2300 03/17/13 0520  TROPONINI <0.30 <0.30 <0.30  PROBNP 137.2  --   --    Glucose  Recent Labs Lab 03/17/13 0024 03/17/13 0142 03/17/13 0412  GLUCAP 212* 171* 115*    Imaging Ct Abdomen Pelvis Wo Contrast  03/17/2013   CLINICAL DATA:  Chest pain; cardiac arrest.  Abdominal pain.  EXAM: CT ABDOMEN AND PELVIS WITHOUT CONTRAST  TECHNIQUE: Multidetector CT imaging of the abdomen and pelvis was performed following the standard protocol without intravenous contrast.  COMPARISON:  None.  FINDINGS: Scattered patchy central airspace opacities are noted bilaterally, compatible with multifocal pneumonia. Associated air bronchograms are noted within consolidation at the lung bases. Diffuse coronary calcifications are seen.  There appears to be mesentero-axial volvulus of the stomach, with the body and antrum of the stomach seen  within a paraesophageal hernia. The patient's enteric tube coils within the fundus, and back into the paraesophageal hernia at the body of the stomach.  The liver and spleen are unremarkable in appearance. The gallbladder is within normal limits. The pancreas and adrenal glands are unremarkable.  Nonspecific perinephric stranding is noted bilaterally. The kidneys are otherwise unremarkable in appearance. There is no evidence of hydronephrosis. No renal or ureteral stones are seen.  No free fluid is identified. The small bowel is unremarkable in appearance. The stomach is within normal limits. No acute vascular abnormalities are seen. Scattered calcification is noted along the abdominal aorta and its branches. There is mild ectasia of the distal abdominal aorta, measuring 2.9 cm in AP dimension, without evidence of aneurysmal dilatation.  The appendix is not definitely seen; there is no evidence for appendicitis. The colon is largely decompressed. Minimal diverticulosis is suggested along the mid sigmoid colon, difficult to fully characterize due to motion artifact.  The bladder is decompressed, with a Foley catheter in place. The prostate remains normal in size. No inguinal lymphadenopathy is seen.  No acute osseous abnormalities are identified. Multilevel vacuum phenomenon noted along the lumbar spine, with underlying facet disease.  IMPRESSION: 1. 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 The stomach is partially filled with fluid and air. The enteric tube coils within the fundus and back into the body of the stomach at the level of the paraesophageal hernia. 2. Patchy bilateral central airspace opacities, compatible with multifocal pneumonia. Associated air bronchograms seen within consolidation of the lung bases. 3. Diffuse coronary calcifications seen. 4. Scattered calcification along the abdominal aorta and its branches, with mild  ectasia of the distal abdominal aorta but no evidence of aneurysmal dilatation. 5. Minimal diverticulosis suggested along the mid sigmoid colon.  These results were called by telephone at the time of interpretation on 03/17/2013 at 3:29 AM to Dr. Darrick Penna, who verbally acknowledged these results.   Electronically Signed   By: Roanna Raider M.D.   On: 03/17/2013 03:32   Dg Chest Port 1 View  03/17/2013   CLINICAL DATA:  Central line placement  EXAM: PORTABLE CHEST - 1 VIEW  COMPARISON:  Portable film at 2256 hr 2013/04/10.  FINDINGS: Left IJ line has been placed and lies with its tip in the proximal to mid SVC. No pneumothorax. Unchanged ET tube in satisfactory position, 3.5 cm above carina. . Nasogastric tube has been placed with the tip lying near the GE junction and the tube coiled in the proximal stomach. There is progression of left lower lobe infiltrate and atelectasis.  IMPRESSION: Worsening aeration with increasing left lower lobe atelectasis and infiltrate.  Satisfactory appearance status post left IJ central venous line with tip in SVC. Marland Kitchen  Nasogastric tube may not be optimally positioned. Correlate clinically.   Electronically Signed   By: Davonna Belling M.D.   On: 03/17/2013 01:40   Dg Chest Port 1 View  18-Mar-2013   CLINICAL DATA:  Endotracheal tube placement.  EXAM: PORTABLE CHEST - 1 VIEW  COMPARISON:  None.  FINDINGS: The patient's endotracheal tube is seen ending 3-4 cm above the carina.  The lungs are well-aerated. Vascular congestion is noted, without definite pulmonary edema. No pleural effusion or pneumothorax is seen.  The cardiomediastinal silhouette remains normal in size. There is suggestion of an underlying moderate hiatal hernia. No acute osseous abnormalities are seen. The patient's left shoulder arthroplasty is incompletely imaged but appears grossly unremarkable  IMPRESSION: 1. Endotracheal tube seen ending 3-4 cm above the carina. 2. Vascular congestion noted; lungs remain  grossly clear. 3. Suggestion of underlying moderate hiatal hernia. If the patient does not have a known history of hiatal hernia, a lateral view of the chest could be considered for further evaluation, though the appearance is relatively typical.   Electronically Signed   By: Roanna Raider M.D.   On: 03/17/2013 23:40    ASSESSMENT / PLAN:  PULMONARY A: Acute respiratory failure 2nd to aspiration pneumonia. P:   -change to pressure control 12/14 -f/u CXR and ABG -add nimbex 12/14 -scheduled BD's  CARDIOVASCULAR A:  Septic shock. Cardiac arrest likely 2nd to respiratory arrest 12/14. P:  -f/u Echo -pressors to keep SBP > 90, MAP > 65 -f/u lactic acid, procalcitonin -f/u cortisol >> add solu cortef pending results  RENAL A:   Acute kidney injury >> not sure what baseline renal fx is. P:   -monitor renal fx, urine outpt, electrolytes  GASTROINTESTINAL A:   Gastric volvulus >> abd exam benign 12/14. P:   -protonix for SUP -NPO -CCS following  HEMATOLOGIC A:   Leukopenia. Thrombocytopenia. P:  -f/u CBC -SQ heparin for DVT prevention  INFECTIOUS A:   Aspiration pneumonia. P:   -continue vancomycin, zosyn  ENDOCRINE A:   Hyperglycemia.   P:   -SSI  NEUROLOGIC A:   Acute encephalopathy 2nd to respiratory failure and sepsis. P:   -sedation protocol while on vent  Updated family at bedside.  D/w Dr. Corliss Skains.  CC time 45 minutes.  Coralyn Helling, MD [facility] Pulmonary/Critical Care 03/17/2013, 9:13 AM Pager:  [phone] After 3pm call: [phone]

## 2013-03-17 NOTE — Progress Notes (Signed)
Pt mode of ventilation changed per Dr.Z verbal order.  ABG ordered in 1 hr.

## 2013-03-17 NOTE — Consult Note (Signed)
Reason for Consult:Gastric volvulus with cardiac arrest Referring Physician: Magnum Lunde is an 79 y.o. male.  HPI: Patient has a known history of a large hiatal hernia.  Had not been feeling well, but got significantly worse yesterday evening when he went out to eat with his family, had a relatively large meal, felt sick afterwards, then started complaining of severe chest pain.  He was picked up by EMS and the plan was to go to [place], but he arrested and was brought to the nearest facility.  Thoughts were that the patient had had an MI, but he quickly resuscitated partially and therefore not cooled.  He had a significant lactic acidosis and a CT was done to demonstrate that he had a gastric volvulus without ischemic changes.  He continues to be hypotensive and acidotic.  Tachycardic.  Ho history of cardiac problems.  No past medical history on file.  No past surgical history on file.  No family history on file.  Social History:  has no tobacco, alcohol, and drug history on file.  Allergies:  Allergies  Allergen Reactions  . Augmentin [Amoxicillin-Pot Clavulanate]     unknown  . Erythromycin     Unknown reaction.    Medications: I have reviewed the patient's current medications.  Results for orders placed during the hospital encounter of 03/09/2013 (from the past 48 hour(s))  CBC WITH DIFFERENTIAL     Status: Abnormal   Collection Time    03/09/2013 10:44 PM      Result Value Range   WBC 16.8 (*) 4.0 - 10.5 K/uL   RBC 4.35  4.22 - 5.81 MIL/uL   Hemoglobin 14.4  13.0 - 17.0 g/dL   HCT 16.1  09.6 - 04.5 %   MCV 98.9  78.0 - 100.0 fL   MCH 33.1  26.0 - 34.0 pg   MCHC 33.5  30.0 - 36.0 g/dL   RDW 40.9  81.1 - 91.4 %   Platelets 123 (*) 150 - 400 K/uL   Neutrophils Relative % 83 (*) 43 - 77 %   Neutro Abs 13.9 (*) 1.7 - 7.7 K/uL   Lymphocytes Relative 12  12 - 46 %   Lymphs Abs 2.0  0.7 - 4.0 K/uL   Monocytes Relative 5  3 - 12 %   Monocytes Absolute 0.8  0.1 -  1.0 K/uL   Eosinophils Relative 1  0 - 5 %   Eosinophils Absolute 0.2  0.0 - 0.7 K/uL   Basophils Relative 0  0 - 1 %   Basophils Absolute 0.0  0.0 - 0.1 K/uL  COMPREHENSIVE METABOLIC PANEL     Status: Abnormal   Collection Time    03/30/2013 10:44 PM      Result Value Range   Sodium 139  135 - 145 mEq/L   Potassium 3.3 (*) 3.5 - 5.1 mEq/L   Chloride 102  96 - 112 mEq/L   CO2 19  19 - 32 mEq/L   Glucose, Bld 269 (*) 70 - 99 mg/dL   BUN 19  6 - 23 mg/dL   Creatinine, Ser 7.82 (*) 0.50 - 1.35 mg/dL   Calcium 8.7  8.4 - 95.6 mg/dL   Total Protein 6.5  6.0 - 8.3 g/dL   Albumin 3.6  3.5 - 5.2 g/dL   AST 25  0 - 37 U/L   ALT 18  0 - 53 U/L   Alkaline Phosphatase 84  39 - 117 U/L   Total Bilirubin  0.4  0.3 - 1.2 mg/dL   GFR calc non Af Amer 47 (*) >90 mL/min   GFR calc Af Amer 55 (*) >90 mL/min   Comment: (NOTE)     The eGFR has been calculated using the CKD EPI equation.     This calculation has not been validated in all clinical situations.     eGFR's persistently <90 mL/min signify possible Chronic Kidney     Disease.  PRO B NATRIURETIC PEPTIDE     Status: None   Collection Time    2013/03/24 10:44 PM      Result Value Range   Pro B Natriuretic peptide (BNP) 137.2  0 - 450 pg/mL  TROPONIN I     Status: None   Collection Time    03-24-2013 10:44 PM      Result Value Range   Troponin I <0.30  <0.30 ng/mL   Comment:            Due to the release kinetics of cTnI,     a negative result within the first hours     of the onset of symptoms does not rule out     myocardial infarction with certainty.     If myocardial infarction is still suspected,     repeat the test at appropriate intervals.  PROTIME-INR     Status: None   Collection Time    March 24, 2013 10:44 PM      Result Value Range   Prothrombin Time 14.8  11.6 - 15.2 seconds   INR 1.19  0.00 - 1.49  POCT I-STAT TROPONIN I     Status: None   Collection Time    24-Mar-2013 10:50 PM      Result Value Range   Troponin i, poc 0.00   0.00 - 0.08 ng/mL   Comment 3            Comment: Due to the release kinetics of cTnI,     a negative result within the first hours     of the onset of symptoms does not rule out     myocardial infarction with certainty.     If myocardial infarction is still suspected,     repeat the test at appropriate intervals.  CG4 I-STAT (LACTIC ACID)     Status: Abnormal   Collection Time    2013/03/24 10:52 PM      Result Value Range   Lactic Acid, Venous 6.96 (*) 0.5 - 2.2 mmol/L  MAGNESIUM     Status: None   Collection Time    2013/03/24 11:00 PM      Result Value Range   Magnesium 2.3  1.5 - 2.5 mg/dL  PHOSPHORUS     Status: None   Collection Time    24-Mar-2013 11:00 PM      Result Value Range   Phosphorus 4.5  2.3 - 4.6 mg/dL  AMYLASE     Status: None   Collection Time    2013/03/24 11:00 PM      Result Value Range   Amylase 66  0 - 105 U/L  LIPASE, BLOOD     Status: Abnormal   Collection Time    2013/03/24 11:00 PM      Result Value Range   Lipase 149 (*) 11 - 59 U/L  TROPONIN I     Status: None   Collection Time    03-24-13 11:00 PM      Result Value Range   Troponin I <0.30  <  0.30 ng/mL   Comment:            Due to the release kinetics of cTnI,     a negative result within the first hours     of the onset of symptoms does not rule out     myocardial infarction with certainty.     If myocardial infarction is still suspected,     repeat the test at appropriate intervals.  LACTIC ACID, PLASMA     Status: Abnormal   Collection Time    04/09/2013 11:00 PM      Result Value Range   Lactic Acid, Venous 6.9 (*) 0.5 - 2.2 mmol/L  APTT     Status: None   Collection Time    04-09-2013 11:00 PM      Result Value Range   aPTT 27  24 - 37 seconds  TRIGLYCERIDES     Status: Abnormal   Collection Time    09-Apr-2013 11:00 PM      Result Value Range   Triglycerides 172 (*) <150 mg/dL  POCT I-STAT 3, BLOOD GAS (G3+)     Status: Abnormal   Collection Time    04/09/13 11:32 PM      Result  Value Range   pH, Arterial 7.110 (*) 7.350 - 7.450   pCO2 arterial 71.8 (*) 35.0 - 45.0 mmHg   pO2, Arterial 69.0 (*) 80.0 - 100.0 mmHg   Bicarbonate 22.8  20.0 - 24.0 mEq/L   TCO2 25  0 - 100 mmol/L   O2 Saturation 85.0     Acid-base deficit 8.0 (*) 0.0 - 2.0 mmol/L   Patient temperature 98.6 F     Collection site RADIAL, ALLEN'S TEST ACCEPTABLE     Drawn by RT     Sample type ARTERIAL     Comment NOTIFIED PHYSICIAN    GLUCOSE, CAPILLARY     Status: Abnormal   Collection Time    03/17/13 12:24 AM      Result Value Range   Glucose-Capillary 212 (*) 70 - 99 mg/dL  POCT I-STAT 3, BLOOD GAS (G3+)     Status: Abnormal   Collection Time    03/17/13  1:36 AM      Result Value Range   pH, Arterial 7.219 (*) 7.350 - 7.450   pCO2 arterial 62.3 (*) 35.0 - 45.0 mmHg   pO2, Arterial 75.0 (*) 80.0 - 100.0 mmHg   Bicarbonate 26.2 (*) 20.0 - 24.0 mEq/L   TCO2 28  0 - 100 mmol/L   O2 Saturation 93.0     Acid-base deficit 4.0 (*) 0.0 - 2.0 mmol/L   Patient temperature 34.7 C     Collection site RADIAL, ALLEN'S TEST ACCEPTABLE     Drawn by Operator     Sample type ARTERIAL     Comment VALUES EXPECTED, NO REPEAT    GLUCOSE, CAPILLARY     Status: Abnormal   Collection Time    03/17/13  1:42 AM      Result Value Range   Glucose-Capillary 171 (*) 70 - 99 mg/dL  MRSA PCR SCREENING     Status: None   Collection Time    03/17/13  2:02 AM      Result Value Range   MRSA by PCR NEGATIVE  NEGATIVE   Comment:            The GeneXpert MRSA Assay (FDA     approved for NASAL specimens     only), is one component of  a     comprehensive MRSA colonization     surveillance program. It is not     intended to diagnose MRSA     infection nor to guide or     monitor treatment for     MRSA infections.  LACTIC ACID, PLASMA     Status: Abnormal   Collection Time    03/17/13  3:55 AM      Result Value Range   Lactic Acid, Venous 2.8 (*) 0.5 - 2.2 mmol/L  GLUCOSE, CAPILLARY     Status: Abnormal    Collection Time    03/17/13  4:12 AM      Result Value Range   Glucose-Capillary 115 (*) 70 - 99 mg/dL    Ct Abdomen Pelvis Wo Contrast  03/17/2013   CLINICAL DATA:  Chest pain; cardiac arrest.  Abdominal pain.  EXAM: CT ABDOMEN AND PELVIS WITHOUT CONTRAST  TECHNIQUE: Multidetector CT imaging of the abdomen and pelvis was performed following the standard protocol without intravenous contrast.  COMPARISON:  None.  FINDINGS: Scattered patchy central airspace opacities are noted bilaterally, compatible with multifocal pneumonia. Associated air bronchograms are noted within consolidation at the lung bases. Diffuse coronary calcifications are seen.  There appears to be mesentero-axial volvulus of the stomach, with the body and antrum of the stomach seen within a paraesophageal hernia. The patient's enteric tube coils within the fundus, and back into the paraesophageal hernia at the body of the stomach.  The liver and spleen are unremarkable in appearance. The gallbladder is within normal limits. The pancreas and adrenal glands are unremarkable.  Nonspecific perinephric stranding is noted bilaterally. The kidneys are otherwise unremarkable in appearance. There is no evidence of hydronephrosis. No renal or ureteral stones are seen.  No free fluid is identified. The small bowel is unremarkable in appearance. The stomach is within normal limits. No acute vascular abnormalities are seen. Scattered calcification is noted along the abdominal aorta and its branches. There is mild ectasia of the distal abdominal aorta, measuring 2.9 cm in AP dimension, without evidence of aneurysmal dilatation.  The appendix is not definitely seen; there is no evidence for appendicitis. The colon is largely decompressed. Minimal diverticulosis is suggested along the mid sigmoid colon, difficult to fully characterize due to motion artifact.  The bladder is decompressed, with a Foley catheter in place. The prostate remains normal in size.  No inguinal lymphadenopathy is seen.  No acute osseous abnormalities are identified. Multilevel vacuum phenomenon noted along the lumbar spine, with underlying facet disease.  IMPRESSION: 1. 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 The stomach is partially filled with fluid and air. The enteric tube coils within the fundus and back into the body of the stomach at the level of the paraesophageal hernia. 2. Patchy bilateral central airspace opacities, compatible with multifocal pneumonia. Associated air bronchograms seen within consolidation of the lung bases. 3. Diffuse coronary calcifications seen. 4. Scattered calcification along the abdominal aorta and its branches, with mild ectasia of the distal abdominal aorta but no evidence of aneurysmal dilatation. 5. Minimal diverticulosis suggested along the mid sigmoid colon.  These results were called by telephone at the time of interpretation on 03/17/2013 at 3:29 AM to Dr. Darrick Penna, who verbally acknowledged these results.   Electronically Signed   By: Roanna Raider M.D.   On: 03/17/2013 03:32   Dg Chest Port 1 View  03/17/2013   CLINICAL DATA:  Central line  placement  EXAM: PORTABLE CHEST - 1 VIEW  COMPARISON:  Portable film at 2256 hr April 12, 2013.  FINDINGS: Left IJ line has been placed and lies with its tip in the proximal to mid SVC. No pneumothorax. Unchanged ET tube in satisfactory position, 3.5 cm above carina. . Nasogastric tube has been placed with the tip lying near the GE junction and the tube coiled in the proximal stomach. There is progression of left lower lobe infiltrate and atelectasis.  IMPRESSION: Worsening aeration with increasing left lower lobe atelectasis and infiltrate.  Satisfactory appearance status post left IJ central venous line with tip in SVC. Marland Kitchen  Nasogastric tube may not be optimally positioned. Correlate clinically.   Electronically Signed   By: Davonna Belling  M.D.   On: 03/17/2013 01:40   Dg Chest Port 1 View  April 12, 2013   CLINICAL DATA:  Endotracheal tube placement.  EXAM: PORTABLE CHEST - 1 VIEW  COMPARISON:  None.  FINDINGS: The patient's endotracheal tube is seen ending 3-4 cm above the carina.  The lungs are well-aerated. Vascular congestion is noted, without definite pulmonary edema. No pleural effusion or pneumothorax is seen.  The cardiomediastinal silhouette remains normal in size. There is suggestion of an underlying moderate hiatal hernia. No acute osseous abnormalities are seen. The patient's left shoulder arthroplasty is incompletely imaged but appears grossly unremarkable  IMPRESSION: 1. Endotracheal tube seen ending 3-4 cm above the carina. 2. Vascular congestion noted; lungs remain grossly clear. 3. Suggestion of underlying moderate hiatal hernia. If the patient does not have a known history of hiatal hernia, a lateral view of the chest could be considered for further evaluation, though the appearance is relatively typical.   Electronically Signed   By: Roanna Raider M.D.   On: 12-Apr-2013 23:40    Review of Systems  Constitutional: Negative for fever and chills.  Cardiovascular: Positive for chest pain.  Gastrointestinal: Positive for abdominal pain.   Blood pressure 99/62, pulse 122, temperature 94.5 F (34.7 C), temperature source Core (Comment), resp. rate 28, height 5\' 9"  (1.753 m), weight 92.8 kg (204 lb 9.4 oz), SpO2 98.00%. Physical Exam  Vitals reviewed. Constitutional: He appears well-developed and well-nourished. He is intubated.  HENT:  Head: Normocephalic and atraumatic.  Eyes: Conjunctivae and EOM are normal. Pupils are equal, round, and reactive to light.  Neck: Normal range of motion. Neck supple.  Cardiovascular: Regular rhythm and normal heart sounds.   Respiratory: Tachypnea noted. He is intubated. He is in respiratory distress.  GI: Soft. He exhibits distension. There is tenderness (questionable tenderness).  There is no rebound and no guarding.  Neurological: He is not disoriented. GCS eye subscore is 2. GCS verbal subscore is 1. GCS motor subscore is 4.    Assessment/Plan: Patient now in shock with possible gastric volvulus.  No abdominal pain or peritonitis, and no evidence of infarction, ischemia or necrosis on CT.  However, the patient still requires significant resuscitation before surgery can be considered  Arterial line placed.  Cherylynn Ridges 03/17/2013, 5:35 AM

## 2013-03-17 NOTE — Progress Notes (Signed)
Vent changes per MD based off ABG results.  Repeat ABG at 0730

## 2013-03-17 NOTE — Progress Notes (Signed)
Pt back from CT as this time. No acute events. Pt tolerated transport well. VSS. Will continue to monitor.  [name], [place] Elizabeth

## 2013-03-17 NOTE — Progress Notes (Signed)
Returned to check on patient and assess escalating pressor needs and ?need for paralytic.  Pt remains very dyssynchronous with vent.  Remains on high dose levophed, vasopressin, neo synephrine with SBP 80's-90's.   Still requiring 100% FiO2.  Abd exam remains essentially benign.   Discussed at length with family at bedside including son.  They are concerned about his possibility of recovery from all of this.  Reassured them that we would discuss any changes in clinical status or plan of care with them.    PLAN -  Add nimbex gtt  Cont heavy sedation  F/u ABG  F/u lactate  F/u CXR  Cont pressors - still some room to titrate up neo  Surgery following - no plan for surgery at this time but will need hernia repair at some point  Cont to discuss goals of care with family   Additional CC time 20 minutes.   Danford Bad, NP 03/17/2013  2:04 PM Pager: [phone] or [phone]

## 2013-03-17 NOTE — Progress Notes (Signed)
SUBJECTIVE: The patient is critical ill and in extremis presently.  Marland Kitchen albuterol  2.5 mg Nebulization Q4H   And  . ipratropium  0.5 mg Nebulization Q4H  . artificial tears  1 application Both Eyes Q8H  . cisatracurium  10 mg Intravenous Once  . heparin  5,000 Units Subcutaneous Q8H  . hydrocortisone sod succinate (SOLU-CORTEF) inj  100 mg Intravenous Q8H  . insulin aspart  2-6 Units Subcutaneous Q4H  . pantoprazole (PROTONIX) IV  40 mg Intravenous QHS  . piperacillin-tazobactam (ZOSYN)  IV  3.375 g Intravenous Q8H  . sodium chloride  1,000 mL Intravenous Once  . [START ON 03/23/2013] vancomycin  1,500 mg Intravenous Q24H   . sodium chloride 150 mL/hr at 03/17/13 0800  . sodium chloride 30 mL/hr at 03/17/13 0800  . cisatracurium (NIMBEX) infusion    . fentaNYL infusion INTRAVENOUS    . norepinephrine (LEVOPHED) Adult infusion 35 mcg/min (03/17/13 0800)  . propofol 25 mcg/kg/min (03/17/13 0800)  . vasopressin (PITRESSIN) infusion - *FOR SHOCK* 0.03 Units/min (03/17/13 0800)    OBJECTIVE: Physical Exam: Filed Vitals:   03/17/13 0703 03/17/13 0712 03/17/13 0800 03/17/13 0810  BP:   104/78   Pulse: 131  130   Temp: 98.6 F (37 C) 98.8 F (37.1 C) 98.6 F (37 C)   TempSrc:   Core (Comment)   Resp: 28 26 24    Height:      Weight:      SpO2: 98%  100% 97%    Intake/Output Summary (Last 24 hours) at 03/17/13 0926 Last data filed at 03/17/13 0800  Gross per 24 hour  Intake 2648.56 ml  Output    497 ml  Net 2151.56 ml    Telemetry reveals sinus tachycardia  GEN- The patient is sedated and ill Head- normocephalic, atraumatic Oropharynx- ETT Neck- supple  Lungs- Coarse BS, tachypneic Heart- tachycardic regular rhythm GI- soft, hypoactive BS Extremities- no clubbing, cyanosis, or edema Skin- no rash or lesion Psych sedated Neuro- sedated  LABS: Basic Metabolic Panel:  Recent Labs  16/10/96 2244 03/16/2013 2300 03/17/13 0650  NA 139  --  142  K 3.3*  --  4.1   CL 102  --  109  CO2 19  --  22  GLUCOSE 269*  --  117*  BUN 19  --  20  CREATININE 1.38*  --  1.38*  CALCIUM 8.7  --  7.8*  MG  --  2.3 1.8  PHOS  --  4.5 3.6   Liver Function Tests:  Recent Labs  03/07/2013 2244  AST 25  ALT 18  ALKPHOS 84  BILITOT 0.4  PROT 6.5  ALBUMIN 3.6    Recent Labs  Mar 18, 2013 2300  LIPASE 149*  AMYLASE 66   CBC:  Recent Labs  03/14/2013 2244 03/17/13 0650  WBC 16.8* 1.8*  NEUTROABS 13.9* 1.6*  HGB 14.4 15.2  HCT 43.0 45.5  MCV 98.9 98.3  PLT 123* 148*   Cardiac Enzymes:  Recent Labs  03/17/2013 2244 Mar 18, 2013 2300 03/17/13 0520  TROPONINI <0.30 <0.30 <0.30   BNP: No components found with this basename: POCBNP,  D-Dimer: No results found for this basename: DDIMER,  in the last 72 hours Hemoglobin A1C: No results found for this basename: HGBA1C,  in the last 72 hours Fasting Lipid Panel:  Recent Labs  03/17/2013 2300  TRIG 172*   Thyroid Function Tests: No results found for this basename: TSH, T4TOTAL, FREET3, T3FREE, THYROIDAB,  in the last 72 hours  Anemia Panel: No results found for this basename: VITAMINB12, FOLATE, FERRITIN, TIBC, IRON, RETICCTPCT,  in the last 72 hours  RADIOLOGY: Ct Abdomen Pelvis Wo Contrast  03/17/2013   CLINICAL DATA:  Chest pain; cardiac arrest.  Abdominal pain.  EXAM: CT ABDOMEN AND PELVIS WITHOUT CONTRAST  TECHNIQUE: Multidetector CT imaging of the abdomen and pelvis was performed following the standard protocol without intravenous contrast.  COMPARISON:  None.  FINDINGS: Scattered patchy central airspace opacities are noted bilaterally, compatible with multifocal pneumonia. Associated air bronchograms are noted within consolidation at the lung bases. Diffuse coronary calcifications are seen.  There appears to be mesentero-axial volvulus of the stomach, with the body and antrum of the stomach seen within a paraesophageal hernia. The patient's enteric tube coils within the fundus, and back into the  paraesophageal hernia at the body of the stomach.  The liver and spleen are unremarkable in appearance. The gallbladder is within normal limits. The pancreas and adrenal glands are unremarkable.  Nonspecific perinephric stranding is noted bilaterally. The kidneys are otherwise unremarkable in appearance. There is no evidence of hydronephrosis. No renal or ureteral stones are seen.  No free fluid is identified. The small bowel is unremarkable in appearance. The stomach is within normal limits. No acute vascular abnormalities are seen. Scattered calcification is noted along the abdominal aorta and its branches. There is mild ectasia of the distal abdominal aorta, measuring 2.9 cm in AP dimension, without evidence of aneurysmal dilatation.  The appendix is not definitely seen; there is no evidence for appendicitis. The colon is largely decompressed. Minimal diverticulosis is suggested along the mid sigmoid colon, difficult to fully characterize due to motion artifact.  The bladder is decompressed, with a Foley catheter in place. The prostate remains normal in size. No inguinal lymphadenopathy is seen.  No acute osseous abnormalities are identified. Multilevel vacuum phenomenon noted along the lumbar spine, with underlying facet disease.  IMPRESSION: 1. 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 The stomach is partially filled with fluid and air. The enteric tube coils within the fundus and back into the body of the stomach at the level of the paraesophageal hernia. 2. Patchy bilateral central airspace opacities, compatible with multifocal pneumonia. Associated air bronchograms seen within consolidation of the lung bases. 3. Diffuse coronary calcifications seen. 4. Scattered calcification along the abdominal aorta and its branches, with mild ectasia of the distal abdominal aorta but no evidence of aneurysmal dilatation. 5. Minimal diverticulosis  suggested along the mid sigmoid colon.  These results were called by telephone at the time of interpretation on 03/17/2013 at 3:29 AM to Dr. Darrick Penna, who verbally acknowledged these results.   Electronically Signed   By: Roanna Raider M.D.   On: 03/17/2013 03:32   Dg Chest Port 1 View  03/17/2013   CLINICAL DATA:  Central line placement  EXAM: PORTABLE CHEST - 1 VIEW  COMPARISON:  Portable film at 2256 hr 2013/04/02.  FINDINGS: Left IJ line has been placed and lies with its tip in the proximal to mid SVC. No pneumothorax. Unchanged ET tube in satisfactory position, 3.5 cm above carina. . Nasogastric tube has been placed with the tip lying near the GE junction and the tube coiled in the proximal stomach. There is progression of left lower lobe infiltrate and atelectasis.  IMPRESSION: Worsening aeration with increasing left lower lobe atelectasis and infiltrate.  Satisfactory appearance status post left IJ central venous line with tip in  SVC. .  Nasogastric tube may not be optimally positioned. Correlate clinically.   Electronically Signed   By: Davonna Belling M.D.   On: 03/17/2013 01:40   Dg Chest Port 1 View  03/15/2013   CLINICAL DATA:  Endotracheal tube placement.  EXAM: PORTABLE CHEST - 1 VIEW  COMPARISON:  None.  FINDINGS: The patient's endotracheal tube is seen ending 3-4 cm above the carina.  The lungs are well-aerated. Vascular congestion is noted, without definite pulmonary edema. No pleural effusion or pneumothorax is seen.  The cardiomediastinal silhouette remains normal in size. There is suggestion of an underlying moderate hiatal hernia. No acute osseous abnormalities are seen. The patient's left shoulder arthroplasty is incompletely imaged but appears grossly unremarkable  IMPRESSION: 1. Endotracheal tube seen ending 3-4 cm above the carina. 2. Vascular congestion noted; lungs remain grossly clear. 3. Suggestion of underlying moderate hiatal hernia. If the patient does not have a known history  of hiatal hernia, a lateral view of the chest could be considered for further evaluation, though the appearance is relatively typical.   Electronically Signed   By: Roanna Raider M.D.   On: 03/21/2013 23:40    ASSESSMENT AND PLAN:  Active Problems:   Cardiac arrest   Septic shock   Aspiration pneumonia   Acute respiratory failure  The patient presents with lactic acidosis in the setting of aspiration pneumonia and volvulus.  His volvulus is likely the cause for his chest pain.  His arrest likely secondary to medical illness.   He is presently in extremis/ critically ill.  He is currently too unstable for surgery.  Dr Craige Cotta is present also at the bedside.  The patient requires multiple pressors at this time.  There is no urgent indication for any CV procedures presently.  Echo is pending. Cardiology is available as needed for further assistance.  The patient is critically ill with multiple organ systems failure and requires high complexity decision making for assessment and support, frequent evaluation and titration of therapies, application of advanced monitoring technologies and extensive interpretation of multiple databases.   Total CCT spent directly with the patient today is 20 minutes.  Hillis Range, MD 03/17/2013 9:26 AM

## 2013-03-17 NOTE — Progress Notes (Signed)
Echocardiogram 2D Echocardiogram has been performed.  Gerald Moses 03/17/2013, 12:40 PM

## 2013-03-17 NOTE — Procedures (Signed)
Arterial Catheter Insertion Procedure Note Gerald Moses [id number] [birth year]  Procedure: Insertion of Arterial Catheter  Indications: Blood pressure monitoring and Frequent blood sampling  Procedure Details Consent: Unable to obtain consent because of emergent medical necessity. Time Out: Verified patient identification, verified procedure, site/side was marked, verified correct patient position, special equipment/implants available, medications/allergies/relevent history reviewed, required imaging and test results available.  Performed  Maximum sterile technique was used including gloves, hand hygiene and sheet. Skin prep: Chlorhexidine; local anesthetic administered 20 gauge catheter was inserted into right radial artery using the Seldinger technique.  Evaluation Blood flow good; BP tracing good. Complications: No apparent complications.   Cherylynn Ridges 03/17/2013

## 2013-03-17 NOTE — Consult Note (Addendum)
Reason for Consult: Chest Pain and witnessed cardiac arrest  Referring Physician: Dr. Wyvonna Plum  Gerald Moses is an 79 y.o. male.   HPI: 79 y/o male seen in consultation with Dr. Marry Guan for evaluation of chest pain and witnessed cardiac arrest by EMS. Patient was intubated prior to my arrival, and there were no family members at bedside; thus, history was obtained by talking to medical staff and chart review.  He reportedly complained of chest pain and called EMS who arrived who found the patient to be awake and alert but tachycardic.  He reportedly received ASA and NTG prior to transfer to [facility].  En-route to [facility] EMS reported that patient became bradycardic and was unconscious and vomiting. He became pulseless requiring CPR for about 2 minutes. EPI and Atropine was given with prompt return of spontaneous circulation (but patient was still unresponsive). EKG obtained 2013-04-14 at 22:39 showed sinus tachycardia with heart rate 126 bpm and non-specific T-wave abnormalities; there is no definite ST-elevation on his EKG. He is currently intubated and has been admitted by pulmonary/critical care team. He is currently hemodynamically stable with BP is 126/73 (he is not on pressors). His initial troponin is negative.  No past medical history on file. Unknown:   No past surgical history on file. Unknown  No family history on file. Unknown  Social History:  has no tobacco, alcohol, and drug history on file. Unknown  Allergies: Allergies no known allergies Unknown  Medications:  Unknown  Results for orders placed during the hospital encounter of 04/14/2013 (from the past 48 hour(s))  CBC WITH DIFFERENTIAL     Status: Abnormal   Collection Time    14-Apr-2013 10:44 PM      Result Value Range   WBC 16.8 (*) 4.0 - 10.5 K/uL   RBC 4.35  4.22 - 5.81 MIL/uL   Hemoglobin 14.4  13.0 - 17.0 g/dL   HCT 16.1  09.6 - 04.5 %   MCV 98.9  78.0 - 100.0 fL   MCH 33.1  26.0 - 34.0 pg    MCHC 33.5  30.0 - 36.0 g/dL   RDW 40.9  81.1 - 91.4 %   Platelets 123 (*) 150 - 400 K/uL   Neutrophils Relative % 83 (*) 43 - 77 %   Neutro Abs 13.9 (*) 1.7 - 7.7 K/uL   Lymphocytes Relative 12  12 - 46 %   Lymphs Abs 2.0  0.7 - 4.0 K/uL   Monocytes Relative 5  3 - 12 %   Monocytes Absolute 0.8  0.1 - 1.0 K/uL   Eosinophils Relative 1  0 - 5 %   Eosinophils Absolute 0.2  0.0 - 0.7 K/uL   Basophils Relative 0  0 - 1 %   Basophils Absolute 0.0  0.0 - 0.1 K/uL  COMPREHENSIVE METABOLIC PANEL     Status: Abnormal   Collection Time    Apr 14, 2013 10:44 PM      Result Value Range   Sodium 139  135 - 145 mEq/L   Potassium 3.3 (*) 3.5 - 5.1 mEq/L   Chloride 102  96 - 112 mEq/L   CO2 19  19 - 32 mEq/L   Glucose, Bld 269 (*) 70 - 99 mg/dL   BUN 19  6 - 23 mg/dL   Creatinine, Ser 7.82 (*) 0.50 - 1.35 mg/dL   Calcium 8.7  8.4 - 95.6 mg/dL   Total Protein 6.5  6.0 - 8.3 g/dL   Albumin 3.6  3.5 - 5.2 g/dL   AST 25  0 - 37 U/L   ALT 18  0 - 53 U/L   Alkaline Phosphatase 84  39 - 117 U/L   Total Bilirubin 0.4  0.3 - 1.2 mg/dL   GFR calc non Af Amer 47 (*) >90 mL/min   GFR calc Af Amer 55 (*) >90 mL/min   Comment: (NOTE)     The eGFR has been calculated using the CKD EPI equation.     This calculation has not been validated in all clinical situations.     eGFR's persistently <90 mL/min signify possible Chronic Kidney     Disease.  PRO B NATRIURETIC PEPTIDE     Status: None   Collection Time    03/08/2013 10:44 PM      Result Value Range   Pro B Natriuretic peptide (BNP) 137.2  0 - 450 pg/mL  TROPONIN I     Status: None   Collection Time    03/10/2013 10:44 PM      Result Value Range   Troponin I <0.30  <0.30 ng/mL   Comment:            Due to the release kinetics of cTnI,     a negative result within the first hours     of the onset of symptoms does not rule out     myocardial infarction with certainty.     If myocardial infarction is still suspected,     repeat the test at  appropriate intervals.  PROTIME-INR     Status: None   Collection Time    03/14/2013 10:44 PM      Result Value Range   Prothrombin Time 14.8  11.6 - 15.2 seconds   INR 1.19  0.00 - 1.49  POCT I-STAT TROPONIN I     Status: None   Collection Time    03/15/2013 10:50 PM      Result Value Range   Troponin i, poc 0.00  0.00 - 0.08 ng/mL   Comment 3            Comment: Due to the release kinetics of cTnI,     a negative result within the first hours     of the onset of symptoms does not rule out     myocardial infarction with certainty.     If myocardial infarction is still suspected,     repeat the test at appropriate intervals.  CG4 I-STAT (LACTIC ACID)     Status: Abnormal   Collection Time    03/28/2013 10:52 PM      Result Value Range   Lactic Acid, Venous 6.96 (*) 0.5 - 2.2 mmol/L  MAGNESIUM     Status: None   Collection Time    03/04/2013 11:00 PM      Result Value Range   Magnesium 2.3  1.5 - 2.5 mg/dL  PHOSPHORUS     Status: None   Collection Time    03/05/2013 11:00 PM      Result Value Range   Phosphorus 4.5  2.3 - 4.6 mg/dL  AMYLASE     Status: None   Collection Time    03/10/2013 11:00 PM      Result Value Range   Amylase 66  0 - 105 U/L  LIPASE, BLOOD     Status: Abnormal   Collection Time    03/11/2013 11:00 PM      Result Value Range   Lipase 149 (*) 11 -  59 U/L  TROPONIN I     Status: None   Collection Time    03-23-2013 11:00 PM      Result Value Range   Troponin I <0.30  <0.30 ng/mL   Comment:            Due to the release kinetics of cTnI,     a negative result within the first hours     of the onset of symptoms does not rule out     myocardial infarction with certainty.     If myocardial infarction is still suspected,     repeat the test at appropriate intervals.  LACTIC ACID, PLASMA     Status: Abnormal   Collection Time    03-23-2013 11:00 PM      Result Value Range   Lactic Acid, Venous 6.9 (*) 0.5 - 2.2 mmol/L  APTT     Status: None   Collection Time     23-Mar-2013 11:00 PM      Result Value Range   aPTT 27  24 - 37 seconds  POCT I-STAT 3, BLOOD GAS (G3+)     Status: Abnormal   Collection Time    03/23/2013 11:32 PM      Result Value Range   pH, Arterial 7.110 (*) 7.350 - 7.450   pCO2 arterial 71.8 (*) 35.0 - 45.0 mmHg   pO2, Arterial 69.0 (*) 80.0 - 100.0 mmHg   Bicarbonate 22.8  20.0 - 24.0 mEq/L   TCO2 25  0 - 100 mmol/L   O2 Saturation 85.0     Acid-base deficit 8.0 (*) 0.0 - 2.0 mmol/L   Patient temperature 98.6 F     Collection site RADIAL, ALLEN'S TEST ACCEPTABLE     Drawn by RT     Sample type ARTERIAL     Comment NOTIFIED PHYSICIAN      Dg Chest Port 1 View  2013/03/23   CLINICAL DATA:  Endotracheal tube placement.  EXAM: PORTABLE CHEST - 1 VIEW  COMPARISON:  None.  FINDINGS: The patient's endotracheal tube is seen ending 3-4 cm above the carina.  The lungs are well-aerated. Vascular congestion is noted, without definite pulmonary edema. No pleural effusion or pneumothorax is seen.  The cardiomediastinal silhouette remains normal in size. There is suggestion of an underlying moderate hiatal hernia. No acute osseous abnormalities are seen. The patient's left shoulder arthroplasty is incompletely imaged but appears grossly unremarkable  IMPRESSION: 1. Endotracheal tube seen ending 3-4 cm above the carina. 2. Vascular congestion noted; lungs remain grossly clear. 3. Suggestion of underlying moderate hiatal hernia. If the patient does not have a known history of hiatal hernia, a lateral view of the chest could be considered for further evaluation, though the appearance is relatively typical.   Electronically Signed   By: Roanna Raider M.D.   On: 2013/03/23 23:40    Review of Systems  Unable to perform ROS: intubated   Blood pressure 126/73, pulse 119, temperature 95.6 F (35.3 C), temperature source Rectal, resp. rate 22, SpO2 93.00%. Physical Exam  Constitutional: He is oriented to person, place, and time. He appears  well-developed and well-nourished. No distress.  HENT:  Head: Normocephalic.  Eyes: Right eye exhibits no discharge. Left eye exhibits no discharge. No scleral icterus.  Neck: Neck supple. No JVD present. No thyromegaly present.  Cardiovascular: Regular rhythm and normal heart sounds.  Exam reveals no friction rub.   No murmur heard. tachycardic  Respiratory: No stridor. He has no wheezes.  He has no rales.  GI: Soft. Bowel sounds are normal. He exhibits no distension. There is no tenderness. There is no rebound and no guarding.  Musculoskeletal: Normal range of motion. He exhibits no edema and no tenderness.  Neurological: He is alert and oriented to person, place, and time. No cranial nerve deficit. Coordination normal.  Skin: No rash noted. He is not diaphoretic. No erythema.  Psychiatric:  Cannot evaluate since patient is intubated and sedated    Assessment/Plan:  Chest Pain Witnessed Cardiac Arrest (en-route to hospital) Ventilator-dependent respiratory failure Aspiration pneumonitis/pneumonia  Patient is currently admitted to cardiac ICU, and he is currently intubated and is hemodynamically stable.  His EKG does not show any evidence of ischemia at this point, and his initial cardiac marker is currently negative.  Per review of available medical record, he does not have any known history of CAD or any known family history of CAD.  I recommend ASA 81mg  qd, Statin, serial cardiac markers to rule MI and to obtain a TTE to evaluate his LV function.  We will re-evaluate the patient after the above studies to determine timing of ischemia evaluation with cardiac catheterization. I discussed patient with on-call interventionalist (Dr. Clifton James) who agreed with above plan of care.   Gerald Moses E 03/17/2013, 12:15 AM

## 2013-03-18 ENCOUNTER — Inpatient Hospital Stay (HOSPITAL_COMMUNITY): Payer: Medicare Other

## 2013-03-18 DIAGNOSIS — K449 Diaphragmatic hernia without obstruction or gangrene: Secondary | ICD-10-CM

## 2013-03-18 DIAGNOSIS — J189 Pneumonia, unspecified organism: Secondary | ICD-10-CM

## 2013-03-18 LAB — PROCALCITONIN: Procalcitonin: 19.37 ng/mL

## 2013-03-18 LAB — URINE CULTURE: Colony Count: NO GROWTH

## 2013-03-18 LAB — BLOOD GAS, ARTERIAL
Bicarbonate: 24.7 mEq/L — ABNORMAL HIGH (ref 20.0–24.0)
Drawn by: 36277
PEEP: 5 cmH2O
Patient temperature: 98.6
RATE: 35 resp/min
pH, Arterial: 7.198 — CL (ref 7.350–7.450)

## 2013-03-18 LAB — POCT I-STAT 3, ART BLOOD GAS (G3+)
Acid-Base Excess: 5 mmol/L — ABNORMAL HIGH (ref 0.0–2.0)
O2 Saturation: 63 %
TCO2: 40 mmol/L (ref 0–100)
pCO2 arterial: 98.4 mmHg (ref 35.0–45.0)

## 2013-03-18 LAB — CBC WITH DIFFERENTIAL/PLATELET
Basophils Relative: 0 % (ref 0–1)
Eosinophils Absolute: 0 10*3/uL (ref 0.0–0.7)
HCT: 42.9 % (ref 39.0–52.0)
Hemoglobin: 14.1 g/dL (ref 13.0–17.0)
Lymphocytes Relative: 8 % — ABNORMAL LOW (ref 12–46)
MCHC: 32.9 g/dL (ref 30.0–36.0)
MCV: 99.5 fL (ref 78.0–100.0)
Monocytes Absolute: 0.6 10*3/uL (ref 0.1–1.0)
Monocytes Relative: 3 % (ref 3–12)
Neutro Abs: 18.3 10*3/uL — ABNORMAL HIGH (ref 1.7–7.7)
Neutrophils Relative %: 89 % — ABNORMAL HIGH (ref 43–77)
RBC: 4.31 MIL/uL (ref 4.22–5.81)
RDW: 13.8 % (ref 11.5–15.5)
WBC: 20.5 10*3/uL — ABNORMAL HIGH (ref 4.0–10.5)

## 2013-03-18 LAB — COMPREHENSIVE METABOLIC PANEL
ALT: 14 U/L (ref 0–53)
AST: 37 U/L (ref 0–37)
Albumin: 2.3 g/dL — ABNORMAL LOW (ref 3.5–5.2)
Alkaline Phosphatase: 65 U/L (ref 39–117)
BUN: 21 mg/dL (ref 6–23)
Chloride: 95 mEq/L — ABNORMAL LOW (ref 96–112)
GFR calc Af Amer: 56 mL/min — ABNORMAL LOW (ref >90)
Glucose, Bld: 373 mg/dL — ABNORMAL HIGH (ref 70–99)
Potassium: 4.2 mEq/L (ref 3.5–5.1)
Sodium: 136 mEq/L (ref 135–145)
Total Bilirubin: 0.5 mg/dL (ref 0.3–1.2)
Total Protein: 4.8 g/dL — ABNORMAL LOW (ref 6.0–8.3)

## 2013-03-18 LAB — MAGNESIUM: Magnesium: 1.4 mg/dL — ABNORMAL LOW (ref 1.5–2.5)

## 2013-03-18 LAB — GLUCOSE, CAPILLARY: Glucose-Capillary: 255 mg/dL — ABNORMAL HIGH (ref 70–99)

## 2013-03-18 LAB — LACTIC ACID, PLASMA: Lactic Acid, Venous: 5.9 mmol/L — ABNORMAL HIGH (ref 0.5–2.2)

## 2013-03-18 MED ORDER — SODIUM BICARBONATE 8.4 % IV SOLN
INTRAVENOUS | Status: AC
  Filled 2013-03-18: qty 100

## 2013-03-18 MED ORDER — NOREPINEPHRINE BITARTRATE 1 MG/ML IJ SOLN
2.0000 ug/min | INTRAVENOUS | Status: DC
  Administered 2013-03-18: 40 ug/min via INTRAVENOUS
  Filled 2013-03-18 (×3): qty 4

## 2013-03-18 MED ORDER — BIOTENE DRY MOUTH MT LIQD
15.0000 mL | Freq: Four times a day (QID) | OROMUCOSAL | Status: DC
  Administered 2013-03-18: 15 mL via OROMUCOSAL

## 2013-03-18 MED ORDER — FENTANYL CITRATE 0.05 MG/ML IJ SOLN
100.0000 ug/h | INTRAMUSCULAR | Status: DC
  Filled 2013-03-18: qty 50

## 2013-03-18 MED ORDER — SODIUM CHLORIDE 0.9 % IV SOLN
10.0000 mg/h | INTRAVENOUS | Status: DC
  Administered 2013-03-18: 5 mg/h via INTRAVENOUS
  Filled 2013-03-18: qty 10

## 2013-03-18 MED ORDER — MAGNESIUM SULFATE 40 MG/ML IJ SOLN
2.0000 g | Freq: Once | INTRAMUSCULAR | Status: DC
  Filled 2013-03-18: qty 50

## 2013-03-18 MED ORDER — SODIUM BICARBONATE 8.4 % IV SOLN
100.0000 meq | Freq: Once | INTRAVENOUS | Status: AC
  Administered 2013-03-18: 100 meq via INTRAVENOUS

## 2013-03-18 MED ORDER — CHLORHEXIDINE GLUCONATE 0.12 % MT SOLN
15.0000 mL | Freq: Two times a day (BID) | OROMUCOSAL | Status: DC
  Administered 2013-03-17 – 2013-03-18 (×2): 15 mL via OROMUCOSAL
  Filled 2013-03-18 (×2): qty 15

## 2013-03-18 MED ORDER — FENTANYL BOLUS VIA INFUSION
50.0000 ug | INTRAVENOUS | Status: DC | PRN
  Filled 2013-03-18: qty 200

## 2013-03-18 MED ORDER — MIDAZOLAM BOLUS VIA INFUSION
5.0000 mg | INTRAVENOUS | Status: DC | PRN
  Filled 2013-03-18: qty 20

## 2013-03-18 MED ORDER — PHENYLEPHRINE HCL 10 MG/ML IJ SOLN
30.0000 ug/min | INTRAMUSCULAR | Status: DC
  Administered 2013-03-18: 200 ug/min via INTRAVENOUS
  Filled 2013-03-18 (×3): qty 4

## 2013-03-18 MED FILL — Medication: Qty: 1 | Status: AC

## 2013-03-22 NOTE — Discharge Summary (Signed)
Name: Gerald Moses MRN: [id number] DOB: Feb 14, 1933  PCCM DEATH NOTE  Time of death:  2013-04-08  14:43  Cause of death: Acute respiratory failure  Discharge diagnoses: Acute respiratory failure Aspiration pneumonia Septic shock Cardiac arrest Acute kidney injury Hypomagnesemia Gastric volvulus Leukopenia Thrombocytopenia Hyperglycemia Suspected adrenal insufficiency Acute encephalopathy  Brief hospital course:  This is 79 yo with chest pain and vomiting for 2 days who suffered brief cardiac arrest in transport to hospital. Aspirated prior to intubation. Her course was complicated by shock and two cardiac arrests.  The patient's condition, hospital course, ongoing treatment and prognosis were discussed with the family.  Questions were answered.  The consensus was reached that following patient's wishes no cardiopulmonary resuscitation should be attempted and comfort measures should be pursued.  Life support was withdrawn and comfort measures provided.  Patient expired shortly after.  No resuscitation was attempted.  Lonia Farber, MD Pulmonary and Critical Care Medicine [facility] Pager: [phone]

## 2013-03-23 LAB — CULTURE, BLOOD (ROUTINE X 2): Culture: NO GROWTH

## 2013-04-04 NOTE — Progress Notes (Signed)
UR Completed.  Gerald Moses [phone] 03/08/2013  

## 2013-04-04 NOTE — Progress Notes (Signed)
Patient expired at 14:43. Family at bedside with patient. Patient pronounced by myself and Suzzette Righter RN. Fentanyl 100cc and Versed 25 cc wasted in the sink. Dr Molli Knock notified of patients time of death in [place].   [facility] donor services notified and declared not a suitable donor

## 2013-04-04 NOTE — Code Documentation (Signed)
CODE BLUE NOTE  Patient Name: Gerald Moses   MRN: [id number]   Date of Birth/ Sex: December 24, 1932 , male      Admission Date: 03/06/2013  Attending Provider: Coralyn Helling, MD  Primary Diagnosis: <principal problem not specified>    Indication: Pt on high dose levophen, vasopressin, neosynephrine with SBPs 80s-90s, requiring 100% FiO2. Was in his usual state of health until this PM, when he was noted to be bradying down with dropping BP. Code blue was subsequently called. At the time of arrival on scene, ACLS protocol was underway.Pt removed from vent and bagging initiated. Pt found to be in PEA. X2 bicarb (abg of 7.183) and X2 epi were administered. CPR initiated but d/c'd 2/2 family's wishes    Technical Description:  - CPR performance duration:  10 minutes  - Was defibrillation or cardioversion used? No  - Was external pacer placed? No  - Was patient intubated pre/post CPR? Yes, already intubated    Medications Administered: Y = Yes; Blank = No Amiodarone    Atropine    Calcium    Epinephrine  Y X2  Lidocaine    Magnesium    Norepinephrine    Phenylephrine    Sodium bicarbonate  Y X2  Vasopressin      Post CPR evaluation:  - Final Status - Was patient successfully resuscitated ? Yes - What is current rhythm? tachycardic - What is current hemodynamic status? stable   Miscellaneous Information:  - Labs sent, including: CMET, CBC, trop  - Primary team notified?  Yes, managing code  - Family Notified? Yes at bedside  - Additional notes/ transfer status: Son opted for DNR after approx 10 minutes of CPR had been performed   Anselm Lis, MD  03/21/2013, 1:31 AM

## 2013-04-04 NOTE — Progress Notes (Signed)
Chaplain responded to spiritual care consult for end of life support. Pt's RN said the pt had just been extubated. Pt's family were in hallway and by bedside, all crying. Chaplain introduced herself to pt's two sons and offered support to the family, but they said "they are fine." Chaplain is available by pager for further support is needed or requested.   Guy Sandifer [place], [place] [phone]

## 2013-04-04 NOTE — Progress Notes (Signed)
Chaplain was paged by nurse to pray with the family of a pt who was actively dying.  Chaplain came and spent time with pt's son and provided a ministry of presence and empathic listening.  Son and chaplain shared in prayer shortly thereafter.  Pt's son seemed grateful for the visit from chaplain services.

## 2013-04-04 NOTE — Progress Notes (Signed)
No suction at this time, family has been called in.

## 2013-04-04 NOTE — Progress Notes (Signed)
Nutrition Brief Note  Chart reviewed.  Pt admitted with chest pain and cardiac arrest; intubated. Pt with apnea and cardiac arrest on ventilator overnight.   Pt is actively dying, now transitioning to comfort care.  No further nutrition interventions warranted at this time.  Please re-consult as needed.   Loyce Dys, MS RD LDN Clinical Inpatient Dietitian Pager: [phone] Weekend/After hours pager: [phone]

## 2013-04-04 NOTE — Progress Notes (Signed)
Rt entered room to find RN watching pt bc pt was bradying down and bp was dropping.  [facility] was contacted and Dr.Deaderding said to remove pt from vent and start bagging and to check pt for a pulse.  Pt was pulseless and drugs were administered as compressions started and pt was bagged by RT. CPR was performed for +-2minutes and pulses returned.  Pt was placed back on vent at this time. Rt will continue to monitor.

## 2013-04-04 NOTE — Procedures (Signed)
 Extubation Procedure Note  Patient Details:   Name: Gerald Moses DOB: 31-Jul-1932 MRN: [id number]   Airway Documentation:  Airway 7.5 mm (Active)  Secured at (cm) 22 cm 03/04/2013  1:27 PM  Measured From Lips 03/27/2013  1:27 PM  Secured Location Left 04/01/2013  1:27 PM  Secured By [employer]   1:27 PM  Tube Holder Repositioned Yes 04/01/2013  1:27 PM  Cuff Pressure (cm H2O) 25 cm H2O 04/02/2013  8:28 AM  Site Condition Dry 03/17/2013  3:33 AM    Evaluation  O2 sats: stable throughout Complications: No apparent complications Patient did tolerate procedure well. Bilateral Breath Sounds: Diminished Suctioning: Airway No Pt extubated using withdrawal of life, pt currently acceptable.  [name], MA 03/31/2013, 2:32 PM

## 2013-04-04 NOTE — Progress Notes (Signed)
 PULMONARY  / CRITICAL CARE MEDICINE  Name: Gerald Moses MRN: [id number] DOB: 29-Aug-1932    ADMISSION DATE:   CONSULTATION DATE: 03/17/2103  REFERRING MD :  EDP PRIMARY SERVICE: PCCM  CHIEF COMPLAINT:  Chest pain  BRIEF PATIENT DESCRIPTION: 80 yo with chest pain and vomiting for 2 days who suffered brief cardiac arrest in transport to hospital.  Aspirated prior to intubation.  SIGNIFICANT EVENTS / STUDIES: 12/13  Admitted with shock / respiratory failure, Cardiology / CCS consulted Mar 19, 2023  Paralytics started as severely hypoxic  2023-03-19  CT abdomen >>> Mesentero-axial volvulus of the stomach, bilateral patchy airspace disease 03/19/2023  TTE >>> EF 55-65% 2023-03-20  Coded, made DNR  LINES / TUBES: OETT 12/13 >>> OGT 12/13 >>> L IJ CVL Mar 19, 2023 >>> R rad A-line 03/19/23 >>>  CULTURES: 12/13  Urine >>> 2023-03-19  MRSA PCR >>> neg 2023-03-19  Flu PCR >>> neg 03/19/23  Blood >>>  ANTIBIOTICS: Vancomycin Mar 19, 2023 >>> Zosyn March 19, 2023 >>>  SUBJECTIVE: Coded overnight. Now DNR.  VITAL SIGNS: Temp:  [99.3 F (37.4 C)-101.3 F (38.5 C)] 101.1 F (38.4 C) 2023-03-20 0900) Pulse Rate:  [119-149] 142 (12/15 0900) Resp:  [14-40] 28 (12/15 0900) BP: (71-219)/(37-113) 173/104 mmHg (12/15 0900) SpO2:  [79 %-100 %] 100 % 2023-03-20 0900) Arterial Line BP: (41-338)/(32-322) 167/93 mmHg (12/15 0900) FiO2 (%):  [80 %-100 %] 80 % 03-20-2023 0828) Weight:  [109 kg (240 lb 4.8 oz)] 109 kg (240 lb 4.8 oz) 03/20/2023 0500) HEMODYNAMICS: CVP:  [10 mmHg-15 mmHg] 15 mmHg VENTILATOR SETTINGS: Vent Mode:  [-] PRVC FiO2 (%):  [80 %-100 %] 80 % Set Rate:  [30 bmp-35 bmp] 35 bmp Vt Set:  [600 mL] 600 mL PEEP:  [5 cmH20] 5 cmH20 Plateau Pressure:  [23 cmH20-36 cmH20] 23 cmH20 INTAKE / OUTPUT: Intake/Output     03/19/2023 0701 - 03-20-23 0700 20-Mar-2023 0701 - 12/16 0700   I.V. (mL/kg) 9201.2 (84.4) 132 (1.2)   IV Piggyback 2650    Total Intake(mL/kg) 11851.2 (108.7) 132 (1.2)   Urine (mL/kg/hr) 1735 (0.7) 350 (0.9)   Emesis/NG  output     Total Output 1735 350   Net +10116.2 -218          PHYSICAL EXAMINATION: General: Mechanically ventilated, synchronous Neuro:  Sluggish pupils, diminished gag HEENT:  OETT / OGT Cardiovascular:  Tachycardic, regular, no murmurs Lungs:  Bilateral air entry, rhonchi Abdomen:  Soft, decreased bowel sounds Musculoskeletal:  No edema Skin:  NO rash  LABS:  CBC  Recent Labs Lab 03/21/2013 2244 March 18, 2013 0650 19-Mar-2013 0125  WBC 16.8* 1.8* 20.5*  HGB 14.4 15.2 14.1  HCT 43.0 45.5 42.9  PLT 123* 148* 117*   Coag's  Recent Labs Lab 03/06/2013 2244 04/02/2013 2300  APTT  --  27  INR 1.19  --    BMET  Recent Labs Lab Mar 18, 2013 0650 Mar 18, 2013 1420 Mar 19, 2013 0125  NA 142 136 136  K 4.1 4.3 4.2  CL 109 105 95*  CO2 22 20 20   BUN 20 20 21   CREATININE 1.38* 1.36* 1.35  GLUCOSE 117* 232* 373*   Electrolytes  Recent Labs Lab 03/10/2013 2300 04/01/2013 0650 03/24/2013 1420 March 19, 2013 0125  CALCIUM  --  7.8* 7.3* 7.0*  MG 2.3 1.8  --  1.4*  PHOS 4.5 3.6  --  3.5   Sepsis Markers  Recent Labs Lab Mar 18, 2013 0355 Mar 18, 2013 1035 03/05/2013 1410 03/26/2013 0125 19-Mar-2013 0502  LATICACIDVEN 2.8*  --  3.6*  --  5.9*  PROCALCITON  --  13.13  --  19.37  --    ABG  Recent Labs Lab 03/17/13 2032 03/17/13 2210 03/08/2013 0405  PHART 7.078* 7.107* 7.198*  PCO2ART 64.1* 60.5* 65.9*  PO2ART 211.0* 225.0* 122.0*   Liver Enzymes  Recent Labs Lab 03/31/2013 2244 04/02/2013 0125  AST 25 37  ALT 18 14  ALKPHOS 84 65  BILITOT 0.4 0.5  ALBUMIN 3.6 2.3*   Cardiac Enzymes  Recent Labs Lab 03/13/2013 2244 04/02/2013 2300 03/17/13 0520 03/17/13 1035  TROPONINI <0.30 <0.30 <0.30 <0.30  PROBNP 137.2  --   --   --    Glucose  Recent Labs Lab 03/17/13 1354 03/17/13 1644 03/17/13 2014 03/09/2013 0004 04/01/2013 0503 03/30/2013 0759  GLUCAP 224* 156* 147* 233* 255* 242*   CXR:  12/15 >>> Hardware in place, bilateral airspace disease  ASSESSMENT / PLAN:  PULMONARY A:   Acute respiratory failure secondary to aspiration pneumonia.  Significant VQ mismatch as hypercarbia in spite of high minute ventilation.  P:   Goal pH>7.30, SpO2>92 Continuous mechanical support, high Ve VAP bundle Daily SBT on hold Trend ABG/CXR Albuterol / Atrovent  CARDIOVASCULAR A:  Septic shock.  Cardiac arrest x 2. P:  Goal MAP>60 Trend Lactate Levophed gtt, titrate to off Neo-Synephrine gtt Vasopressin gtt  RENAL A:  Acute kidney injury (baseline unknown). Hypomagnesemia. P:   Trend BMP Bicarbonate gtt, keep until pH>7.25   GASTROINTESTINAL A:  Gastric volvulus by imaging. P:   CCS following NPO Protonix for GI Px  HEMATOLOGIC A:  Leukopenia. Thrombocytopenia. P:  Trend CBC Heparin for DVT Px  INFECTIOUS A:  Aspiration pneumonia. P:   Abx / cultures as above   ENDOCRINE A:  Hyperglycemia.  Presumed adrenal insufficiency. P:   SSI Hydrocortisone 50 q6h  NEUROLOGIC A:  Acute encephalopathy. P:   Fentanyl / Propofol / Nimbex gtt  Extensive discussion with family. We discussed the poor prognosis and likely poor quality of life. Family has decided to offer full comfort care. They have been fully updated on the process and expectations.  I have personally obtained history, examined patient, evaluated and interpreted laboratory and imaging results, reviewed medical records, formulated assessment / plan and placed orders.  CRITICAL CARE:  The patient is critically ill with multiple organ systems failure and requires high complexity decision making for assessment and support, frequent evaluation and titration of therapies, application of advanced monitoring technologies and extensive interpretation of multiple databases. Critical Care Time devoted to patient care services described in this note is 35 minutes.   Lonia Farber, MD Pulmonary and Critical Care Medicine [facility] Pager: [phone]  03/23/2013, 10:42 AM

## 2013-04-04 NOTE — Evaluation (Signed)
Patient on PEA, ACLS initiated, on 3 pressors. Returned to spontaneous circulation. Son at bedside decided to transition to DNR. Will continue current treatment. Overton Mam, MD [facility] PCCM

## 2013-04-04 NOTE — Progress Notes (Signed)
Subjective: On vent, coded again overnight  Objective: Vital signs in last 24 hours: Temp:  [99 F (37.2 C)-101.1 F (38.4 C)] 101.1 F (38.4 C) (12/15 0700) Pulse Rate:  [119-149] 135 (12/15 0700) Resp:  [14-40] 17 (12/15 0700) BP: (71-219)/(37-113) 174/113 mmHg (12/15 0700) SpO2:  [79 %-100 %] 100 % (12/15 0700) Arterial Line BP: (41-338)/(32-322) 188/88 mmHg (12/15 0700) FiO2 (%):  [100 %] 100 % (12/15 0600) Weight:  [240 lb 4.8 oz (109 kg)] 240 lb 4.8 oz (109 kg) (12/15 0500) Last BM Date: 03/17/13  Intake/Output from previous day: 12/14 0701 - 12/15 0700 In: 11719.2 [I.V.:9069.2; IV Piggyback:2650] Out: 1735 [Urine:1735] Intake/Output this shift:    Resp: few rhonchi Cardio: tachy 130s GI: soft, mild dist, quiet Neuro: sedated  Lab Results:   Recent Labs  03/17/13 0650 03/30/2013 0125  WBC 1.8* 20.5*  HGB 15.2 14.1  HCT 45.5 42.9  PLT 148* 117*   BMET  Recent Labs  03/17/13 1420 03/22/2013 0125  NA 136 136  K 4.3 4.2  CL 105 95*  CO2 20 20  GLUCOSE 232* 373*  BUN 20 21  CREATININE 1.36* 1.35  CALCIUM 7.3* 7.0*   PT/INR  Recent Labs  04/03/2013 2244  LABPROT 14.8  INR 1.19   ABG  Recent Labs  03/17/13 2210 03/27/2013 0405  PHART 7.107* 7.198*  HCO3 18.0* 24.7*    Studies/Results: Ct Abdomen Pelvis Wo Contrast  03/17/2013   CLINICAL DATA:  Chest pain; cardiac arrest.  Abdominal pain.  EXAM: CT ABDOMEN AND PELVIS WITHOUT CONTRAST  TECHNIQUE: Multidetector CT imaging of the abdomen and pelvis was performed following the standard protocol without intravenous contrast.  COMPARISON:  None.  FINDINGS: Scattered patchy central airspace opacities are noted bilaterally, compatible with multifocal pneumonia. Associated air bronchograms are noted within consolidation at the lung bases. Diffuse coronary calcifications are seen.  There appears to be mesentero-axial volvulus of the stomach, with the body and antrum of the stomach seen within a  paraesophageal hernia. The patient's enteric tube coils within the fundus, and back into the paraesophageal hernia at the body of the stomach.  The liver and spleen are unremarkable in appearance. The gallbladder is within normal limits. The pancreas and adrenal glands are unremarkable.  Nonspecific perinephric stranding is noted bilaterally. The kidneys are otherwise unremarkable in appearance. There is no evidence of hydronephrosis. No renal or ureteral stones are seen.  No free fluid is identified. The small bowel is unremarkable in appearance. The stomach is within normal limits. No acute vascular abnormalities are seen. Scattered calcification is noted along the abdominal aorta and its branches. There is mild ectasia of the distal abdominal aorta, measuring 2.9 cm in AP dimension, without evidence of aneurysmal dilatation.  The appendix is not definitely seen; there is no evidence for appendicitis. The colon is largely decompressed. Minimal diverticulosis is suggested along the mid sigmoid colon, difficult to fully characterize due to motion artifact.  The bladder is decompressed, with a Foley catheter in place. The prostate remains normal in size. No inguinal lymphadenopathy is seen.  No acute osseous abnormalities are identified. Multilevel vacuum phenomenon noted along the lumbar spine, with underlying facet disease.  IMPRESSION: 1. 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 The stomach is partially filled with fluid and air. The enteric tube coils within the fundus and back into the body of the stomach at the level of the paraesophageal hernia. 2. Patchy bilateral  central airspace opacities, compatible with multifocal pneumonia. Associated air bronchograms seen within consolidation of the lung bases. 3. Diffuse coronary calcifications seen. 4. Scattered calcification along the abdominal aorta and its branches, with mild ectasia of  the distal abdominal aorta but no evidence of aneurysmal dilatation. 5. Minimal diverticulosis suggested along the mid sigmoid colon.  These results were called by telephone at the time of interpretation on 03/17/2013 at 3:29 AM to Dr. Darrick Penna, who verbally acknowledged these results.   Electronically Signed   By: Roanna Raider M.D.   On: 03/17/2013 03:32   Dg Chest Port 1 View  03/10/2013   CLINICAL DATA:  Pneumonia.  Status post cardiac resuscitation.  EXAM: PORTABLE CHEST - 1 VIEW  COMPARISON:  One-view chest 03/17/2013  FINDINGS: The heart size is normal. The endotracheal tube terminates 4.5 cm above the carina, in satisfactory position. A left IJ line is stable. Left perihilar and lower lobe airspace disease has progressed. There is slight increase in right-sided airspace disease as well. An element of pulmonary vascular congestion has progressed. There is no pneumothorax.  IMPRESSION: 1. The support apparatus is stable. 2. Increasing pulmonary vascular congestion and bilateral airspace disease, left greater than right. Edema versus infection.   Electronically Signed   By: Gennette Pac M.D.   On: 03/16/2013 07:50   Dg Chest Port 1 View  03/17/2013   CLINICAL DATA:  Central line placement  EXAM: PORTABLE CHEST - 1 VIEW  COMPARISON:  Portable film at 2256 hr 04/02/2013.  FINDINGS: Left IJ line has been placed and lies with its tip in the proximal to mid SVC. No pneumothorax. Unchanged ET tube in satisfactory position, 3.5 cm above carina. . Nasogastric tube has been placed with the tip lying near the GE junction and the tube coiled in the proximal stomach. There is progression of left lower lobe infiltrate and atelectasis.  IMPRESSION: Worsening aeration with increasing left lower lobe atelectasis and infiltrate.  Satisfactory appearance status post left IJ central venous line with tip in SVC. Marland Kitchen  Nasogastric tube may not be optimally positioned. Correlate clinically.   Electronically Signed   By:  Davonna Belling M.D.   On: 03/17/2013 01:40   Dg Chest Port 1 View  03/13/2013   CLINICAL DATA:  Endotracheal tube placement.  EXAM: PORTABLE CHEST - 1 VIEW  COMPARISON:  None.  FINDINGS: The patient's endotracheal tube is seen ending 3-4 cm above the carina.  The lungs are well-aerated. Vascular congestion is noted, without definite pulmonary edema. No pleural effusion or pneumothorax is seen.  The cardiomediastinal silhouette remains normal in size. There is suggestion of an underlying moderate hiatal hernia. No acute osseous abnormalities are seen. The patient's left shoulder arthroplasty is incompletely imaged but appears grossly unremarkable  IMPRESSION: 1. Endotracheal tube seen ending 3-4 cm above the carina. 2. Vascular congestion noted; lungs remain grossly clear. 3. Suggestion of underlying moderate hiatal hernia. If the patient does not have a known history of hiatal hernia, a lateral view of the chest could be considered for further evaluation, though the appearance is relatively typical.   Electronically Signed   By: Roanna Raider M.D.   On: 04/01/2013 23:40    Anti-infectives: Anti-infectives   Start     Dose/Rate Route Frequency Ordered Stop   03/09/2013 0600  vancomycin (VANCOCIN) IVPB 1000 mg/200 mL premix  Status:  Discontinued     1,000 mg 200 mL/hr over 60 Minutes Intravenous Every 24 hours 03/17/13 0039 03/17/13 0912  03/21/2013 0100  vancomycin (VANCOCIN) 1,500 mg in sodium chloride 0.9 % 500 mL IVPB     1,500 mg 250 mL/hr over 120 Minutes Intravenous Every 24 hours 03/17/13 0912     03/17/13 0200  piperacillin-tazobactam (ZOSYN) IVPB 3.375 g     3.375 g 12.5 mL/hr over 240 Minutes Intravenous Every 8 hours 03/17/13 0039     03/17/13 0045  vancomycin (VANCOCIN) 1,500 mg in sodium chloride 0.9 % 500 mL IVPB     1,500 mg 250 mL/hr over 120 Minutes Intravenous  Once 03/17/13 0039 03/17/13 0327      Assessment/Plan: Hiatal hernia - very unstable overall and on very high dose  pressors. Coded again overnight. I do not feel he would survive any operative intervention at this time. I spoke to his family at the bedside. VDRF, PNA, sepsis - per CCM  LOS: 2 days    Gerald Moses E 03/17/2013

## 2013-04-04 NOTE — Progress Notes (Signed)
Pt witnessed to be apneic with ventialtor, atrial BP dropped to 41/32, no pluses felt on radial or femoral. Apical pulse briefly heard and then stopped. Code Blue was started at 0107 03/11/2013. See code sheet.

## 2013-04-04 DEATH — deceased

## 2014-08-03 IMAGING — CT CT ABD-PELV W/O CM
2 of 4 series · 16 of 46 positions shown, 18 images · non-contrast
Comparison: None.

CLINICAL DATA: Chest pain; cardiac arrest.  Abdominal pain.

EXAM:
CT ABDOMEN AND PELVIS WITHOUT CONTRAST
TECHNIQUE: Multidetector CT imaging of the abdomen and pelvis was performed
following the standard protocol without intravenous contrast.

[Series 2: routine · axial · 0.87mm/px · z∈[+507,+967]mm · 13 of 102 slices shown, 15 images]
[im 5/102  soft-tissue]
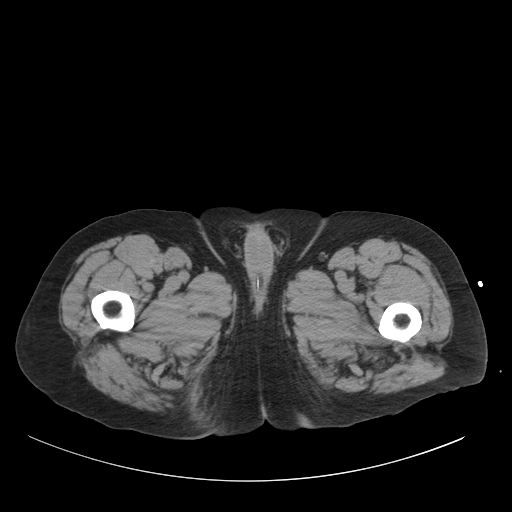
[im 5/102  bone]
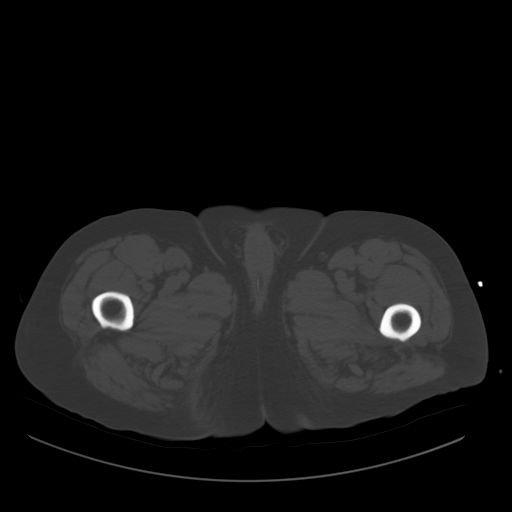
[im 14/102  soft-tissue]
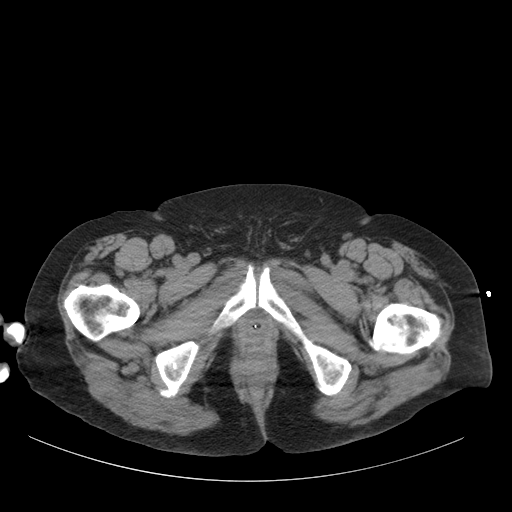
[im 22/102  soft-tissue]
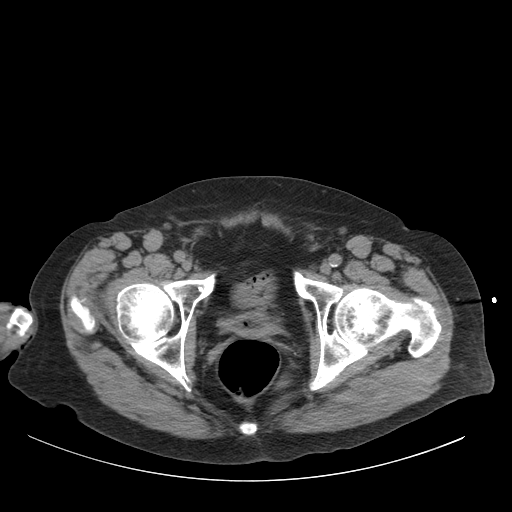
[im 27/102  soft-tissue]
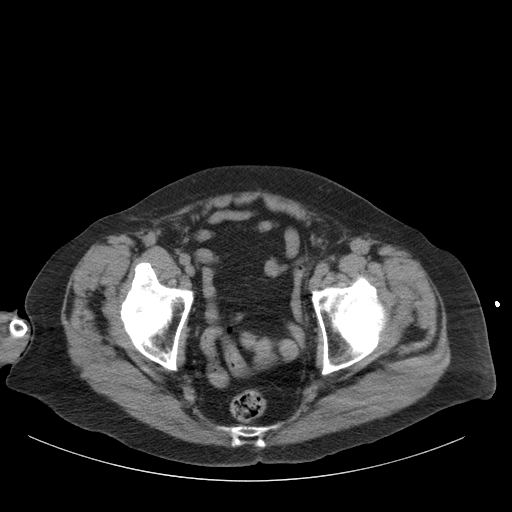
[im 36/102  soft-tissue]
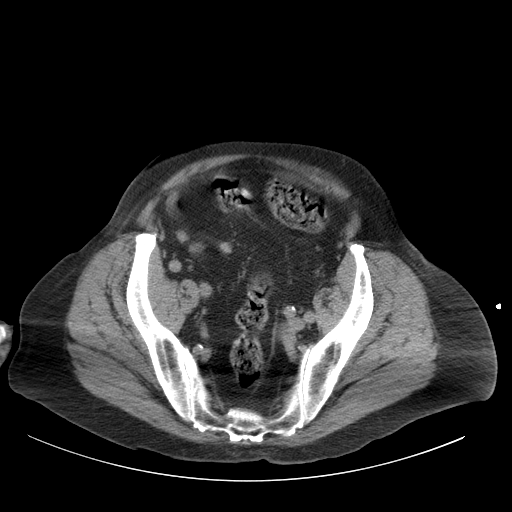
[im 44/102  soft-tissue]
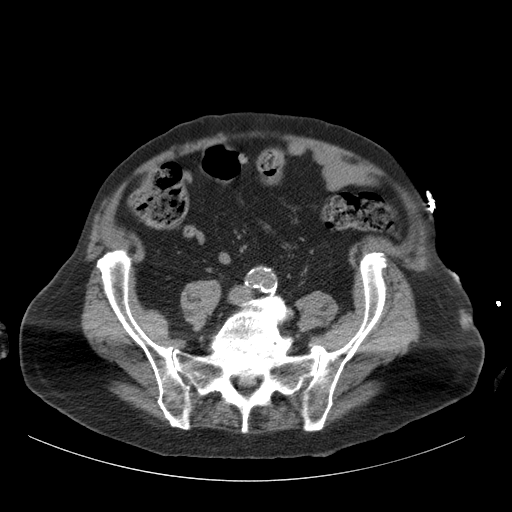
[im 53/102  soft-tissue]
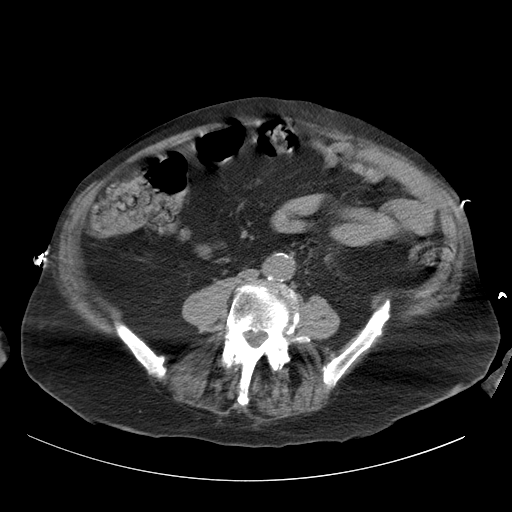
[im 58/102  soft-tissue]
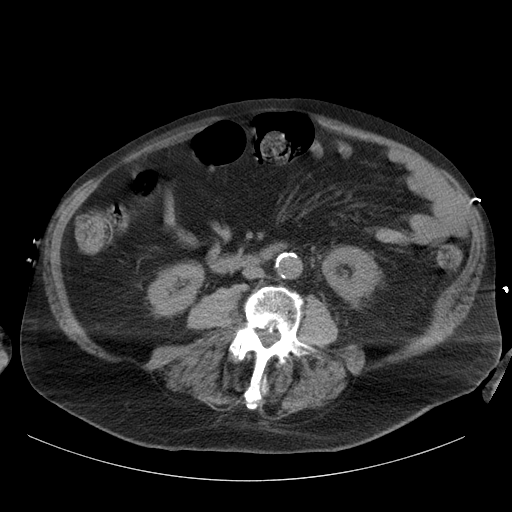
[im 66/102  soft-tissue]
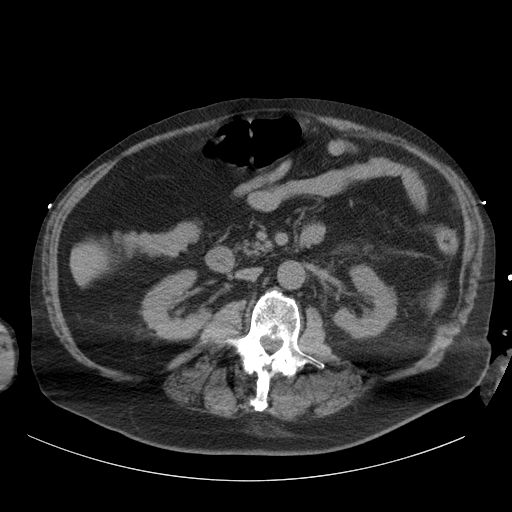
[im 66/102  bone]
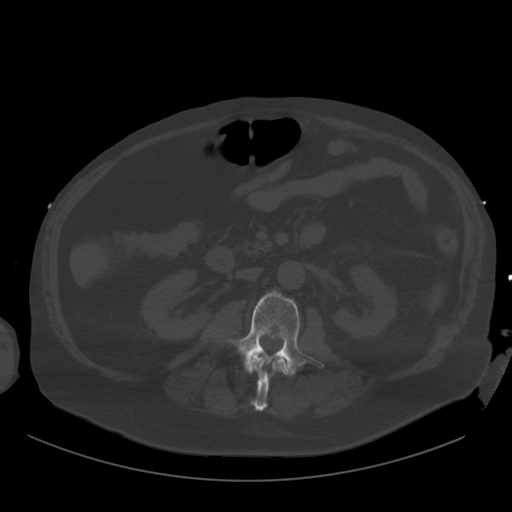
[im 75/102  soft-tissue]
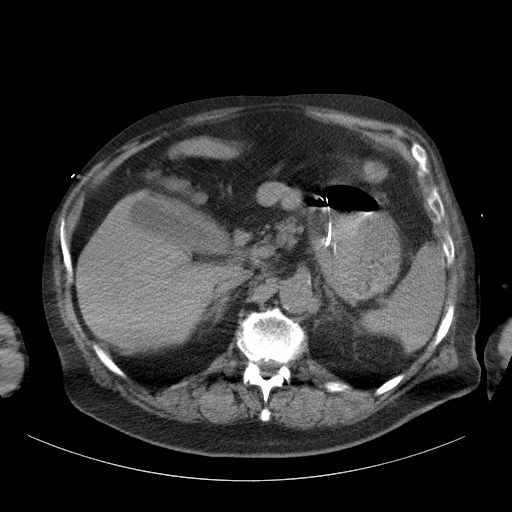
[im 80/102  soft-tissue]
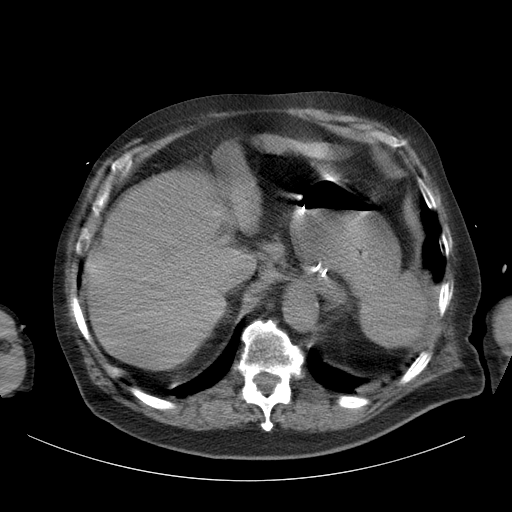
[im 88/102  soft-tissue]
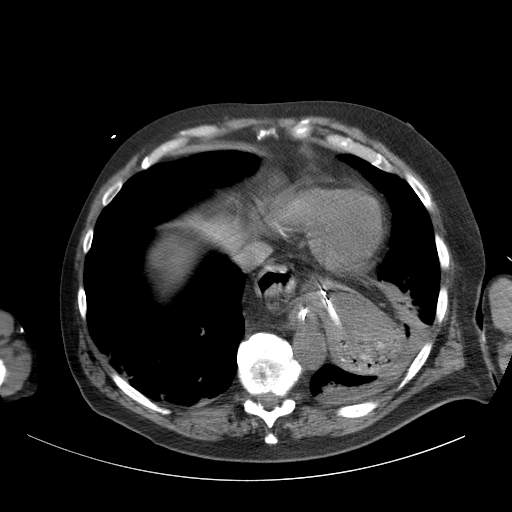
[im 97/102  soft-tissue]
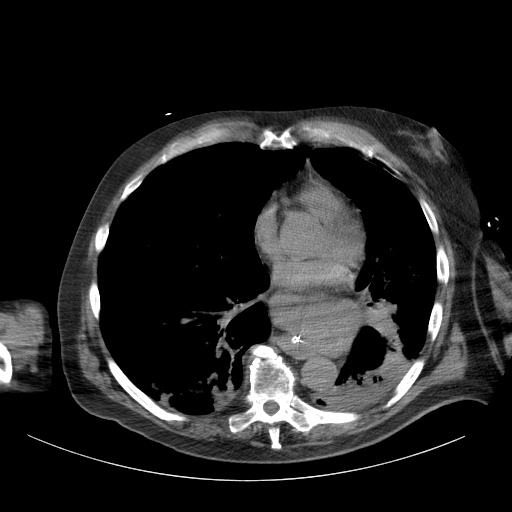

[coronals · coronal · 0.98mm/px · 3 of 121 slices shown]
[im 41/121  soft-tissue]
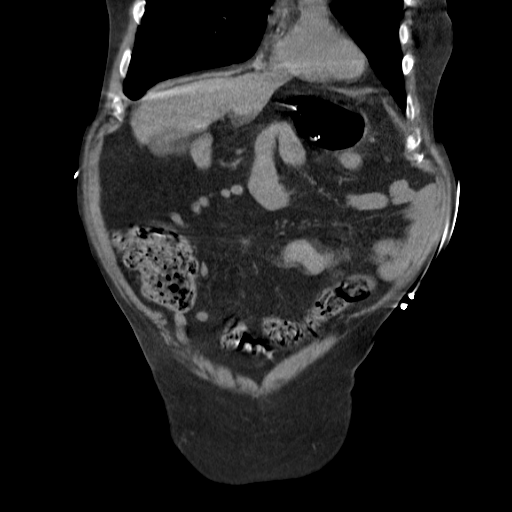
[im 54/121  soft-tissue]
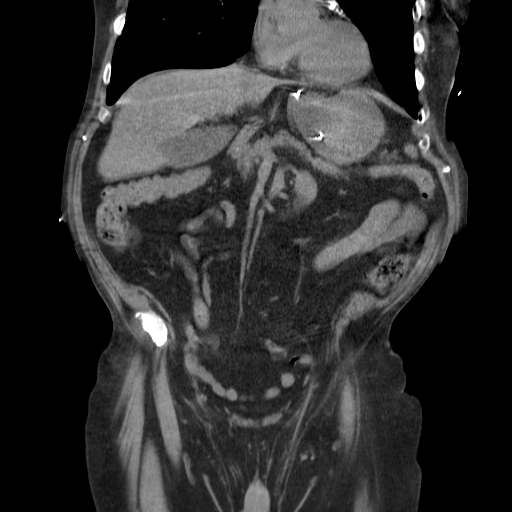
[im 67/121  soft-tissue]
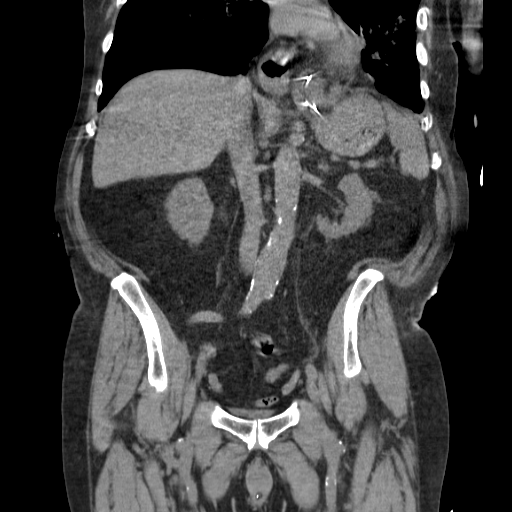

[16 of 46 positions shown; findings below may reference images not displayed]

FINDINGS: Scattered patchy central airspace opacities are noted bilaterally,
compatible with multifocal pneumonia. Associated air bronchograms
are noted within consolidation at the lung bases. Diffuse coronary
calcifications are seen.

There appears to be mesentero-axial volvulus of the stomach, with
the body and antrum of the stomach seen within a paraesophageal
hernia. The patient's enteric tube coils within the fundus, and back
into the paraesophageal hernia at the body of the stomach.

The liver and spleen are unremarkable in appearance. The gallbladder
is within normal limits. The pancreas and adrenal glands are
unremarkable.

Nonspecific perinephric stranding is noted bilaterally. The kidneys
are otherwise unremarkable in appearance. There is no evidence of
hydronephrosis. No renal or ureteral stones are seen.

No free fluid is identified. The small bowel is unremarkable in
appearance. The stomach is within normal limits. No acute vascular
abnormalities are seen. Scattered calcification is noted along the
abdominal aorta and its branches. There is mild ectasia of the
distal abdominal aorta, measuring 2.9 cm in AP dimension, without
evidence of aneurysmal dilatation.

The appendix is not definitely seen; there is no evidence for
appendicitis. The colon is largely decompressed. Minimal
diverticulosis is suggested along the mid sigmoid colon, difficult
to fully characterize due to motion artifact.

The bladder is decompressed, with a Foley catheter in place. The
prostate remains normal in size. No inguinal lymphadenopathy is
seen.

No acute osseous abnormalities are identified. Multilevel vacuum
phenomenon noted along the lumbar spine, with underlying facet
disease.
IMPRESSION: 1. Mesentero-axial volvulus of the stomach, with the body and antrum
of the stomach seen within a paraesophageal hernia. The stomach is
partially filled with fluid and air. The enteric tube coils within
the fundus and back into the body of the stomach at the level of the
paraesophageal hernia.
2. Patchy bilateral central airspace opacities, compatible with
multifocal pneumonia. Associated air bronchograms seen within
consolidation of the lung bases.
3. Diffuse coronary calcifications seen.
4. Scattered calcification along the abdominal aorta and its
branches, with mild ectasia of the distal abdominal aorta but no
evidence of aneurysmal dilatation.
5. Minimal diverticulosis suggested along the mid sigmoid colon.

These results were called by telephone at the time of interpretation
on 03/17/2013 at [DATE] to Dr. Quita, who verbally acknowledged
these results.
